# Patient Record
Sex: Female | Born: 1960
Health system: Southern US, Community
[De-identification: ages and names within clinical notes are randomized; demographics above are authoritative.]

## PROBLEM LIST (undated history)

## (undated) DIAGNOSIS — G43909 Migraine, unspecified, not intractable, without status migrainosus: Secondary | ICD-10-CM

## (undated) DIAGNOSIS — E785 Hyperlipidemia, unspecified: Secondary | ICD-10-CM

## (undated) DIAGNOSIS — M1990 Unspecified osteoarthritis, unspecified site: Secondary | ICD-10-CM

## (undated) DIAGNOSIS — E119 Type 2 diabetes mellitus without complications: Secondary | ICD-10-CM

## (undated) DIAGNOSIS — D649 Anemia, unspecified: Secondary | ICD-10-CM

## (undated) DIAGNOSIS — Z9989 Dependence on other enabling machines and devices: Secondary | ICD-10-CM

## (undated) DIAGNOSIS — G4730 Sleep apnea, unspecified: Secondary | ICD-10-CM

## (undated) DIAGNOSIS — F419 Anxiety disorder, unspecified: Secondary | ICD-10-CM

## (undated) DIAGNOSIS — B019 Varicella without complication: Secondary | ICD-10-CM

## (undated) DIAGNOSIS — J302 Other seasonal allergic rhinitis: Secondary | ICD-10-CM

## (undated) DIAGNOSIS — E079 Disorder of thyroid, unspecified: Secondary | ICD-10-CM

## (undated) DIAGNOSIS — N6320 Unspecified lump in the left breast, unspecified quadrant: Secondary | ICD-10-CM

## (undated) DIAGNOSIS — G4733 Obstructive sleep apnea (adult) (pediatric): Secondary | ICD-10-CM

## (undated) DIAGNOSIS — K219 Gastro-esophageal reflux disease without esophagitis: Secondary | ICD-10-CM

## (undated) DIAGNOSIS — G629 Polyneuropathy, unspecified: Secondary | ICD-10-CM

## (undated) HISTORY — DX: Unspecified osteoarthritis, unspecified site: M19.90

## (undated) HISTORY — DX: Obstructive sleep apnea (adult) (pediatric): G47.33

## (undated) HISTORY — DX: Migraine, unspecified, not intractable, without status migrainosus: G43.909

## (undated) HISTORY — DX: Disorder of thyroid, unspecified: E07.9

## (undated) HISTORY — DX: Varicella without complication: B01.9

## (undated) HISTORY — DX: Anemia, unspecified: D64.9

## (undated) HISTORY — DX: Polyneuropathy, unspecified: G62.9

## (undated) HISTORY — DX: Dependence on other enabling machines and devices: Z99.89

## (undated) HISTORY — DX: Hyperlipidemia, unspecified: E78.5

## (undated) HISTORY — PX: BREAST EXCISIONAL BIOPSY: SUR124

## (undated) HISTORY — DX: Other seasonal allergic rhinitis: J30.2

## (undated) HISTORY — DX: Anxiety disorder, unspecified: F41.9

## (undated) HISTORY — PX: BREAST SURGERY: SHX581

## (undated) HISTORY — PX: TUBAL LIGATION: SHX77

---

## 1996-10-20 HISTORY — PX: KNEE ARTHROSCOPY: SUR90

## 2012-02-26 HISTORY — PX: LUMBAR DISC SURGERY: SHX700

## 2012-12-01 ENCOUNTER — Ambulatory Visit (INDEPENDENT_AMBULATORY_CARE_PROVIDER_SITE_OTHER): Payer: 59 | Admitting: Family Medicine

## 2012-12-01 ENCOUNTER — Encounter: Payer: Self-pay | Admitting: Family Medicine

## 2012-12-01 VITALS — BP 112/78 | HR 76 | Temp 98.2°F | Ht 67.25 in | Wt 209.0 lb

## 2012-12-01 DIAGNOSIS — D509 Iron deficiency anemia, unspecified: Secondary | ICD-10-CM

## 2012-12-01 DIAGNOSIS — G4700 Insomnia, unspecified: Secondary | ICD-10-CM

## 2012-12-01 DIAGNOSIS — M792 Neuralgia and neuritis, unspecified: Secondary | ICD-10-CM

## 2012-12-01 DIAGNOSIS — Z0000 Encounter for general adult medical examination without abnormal findings: Secondary | ICD-10-CM

## 2012-12-01 DIAGNOSIS — G8929 Other chronic pain: Secondary | ICD-10-CM

## 2012-12-01 DIAGNOSIS — IMO0002 Reserved for concepts with insufficient information to code with codable children: Secondary | ICD-10-CM

## 2012-12-01 DIAGNOSIS — G5790 Unspecified mononeuropathy of unspecified lower limb: Secondary | ICD-10-CM

## 2012-12-01 LAB — CBC WITH DIFFERENTIAL/PLATELET
Basophils Relative: 0.9 % (ref 0.0–3.0)
Eosinophils Absolute: 0.3 10*3/uL (ref 0.0–0.7)
HCT: 40 % (ref 36.0–46.0)
Lymphs Abs: 3.1 10*3/uL (ref 0.7–4.0)
MCHC: 32.4 g/dL (ref 30.0–36.0)
MCV: 87.8 fl (ref 78.0–100.0)
Monocytes Absolute: 0.4 10*3/uL (ref 0.1–1.0)
Neutro Abs: 2.3 10*3/uL (ref 1.4–7.7)
Neutrophils Relative %: 37.8 % — ABNORMAL LOW (ref 43.0–77.0)
RBC: 4.55 Mil/uL (ref 3.87–5.11)

## 2012-12-01 LAB — LIPID PANEL
Cholesterol: 266 mg/dL — ABNORMAL HIGH (ref 0–200)
HDL: 53 mg/dL (ref >39.00)
Triglycerides: 161 mg/dL — ABNORMAL HIGH (ref 0.0–149.0)
VLDL: 32.2 mg/dL (ref 0.0–40.0)

## 2012-12-01 LAB — BASIC METABOLIC PANEL
BUN: 12 mg/dL (ref 6–23)
Calcium: 9.4 mg/dL (ref 8.4–10.5)
Creatinine, Ser: 0.9 mg/dL (ref 0.4–1.2)
GFR: 81.06 mL/min (ref >60.00)
Glucose, Bld: 113 mg/dL — ABNORMAL HIGH (ref 70–99)
Potassium: 3.7 mEq/L (ref 3.5–5.1)

## 2012-12-01 LAB — TSH: TSH: 1.76 u[IU]/mL (ref 0.35–5.50)

## 2012-12-01 LAB — HEPATIC FUNCTION PANEL
Albumin: 4.1 g/dL (ref 3.5–5.2)
Total Bilirubin: 0.4 mg/dL (ref 0.3–1.2)

## 2012-12-01 MED ORDER — PREGABALIN 75 MG PO CAPS: 75.0000 mg | ORAL_CAPSULE | Freq: Two times a day (BID) | ORAL | Status: DC

## 2012-12-01 NOTE — Patient Instructions (Signed)
Schedule your complete physical w/ pap at your convenience We'll notify you of your lab results and make any changes if needed Start the Lyrica twice daily- please call if it gives you a rash We'll call you with your neurosurgery appt Call with any questions or concerns Happy Belated Birthday! Welcome!  We're glad to have you!!!

## 2012-12-01 NOTE — Progress Notes (Signed)
  Subjective:    Patient ID: Crystal Buchanan, female    DOB: 1961/03/22, 52 y.o.   MRN: [id number]  HPI New to establish.  Recently moved from [place].  Back pain- got hurt on the job March 2013, R sided back pain.  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  Had R sided back surgery May 2013.  Has residual nerve issues w/ numbness of posterior thigh and R foot w/ painful burning.  Pain is constant.  Pt was previously on gabapentin which caused nausea and a rash on chest.  Stopped taking med.  Now ambulating w/ cane.  Currently on Valium for back spasm, naproxen for inflammation.  Unable to sleep due to leg pain.  Has never been on sleep meds.  Anemia- chronic problem, most likely due to menorrhagia.  Still having regular menstrual cycles.  Health maintenance- pt has not had recent routine physical, Mammo Jan 2012, overdue on pap.  Has never had colonoscopy.   Review of Systems For ROS see HPI     Objective:   Physical Exam  Vitals reviewed. Constitutional: She is oriented to person, place, and time. She appears well-developed and well-nourished. No distress.  HENT:  Head: Normocephalic and atraumatic.  Eyes: Conjunctivae and EOM are normal. Pupils are equal, round, and reactive to light.  Neck: Normal range of motion. Neck supple. No thyromegaly present.  Cardiovascular: Normal rate, regular rhythm, normal heart sounds and intact distal pulses.   No murmur heard. Pulmonary/Chest: Effort normal and breath sounds normal. No respiratory distress.  Musculoskeletal: She exhibits no edema.  Lymphadenopathy:    She has no cervical adenopathy.  Neurological: She is alert and oriented to person, place, and time.  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  Psychiatric: She has a normal mood and affect. Her behavior is normal.          Assessment & Plan:

## 2012-12-05 NOTE — Assessment & Plan Note (Signed)
New to provider, ongoing for pt since back surgery.  Will refer to local neurosurg.

## 2012-12-05 NOTE — Assessment & Plan Note (Signed)
New.  Suspect this is due to pt's neuropathic pain.  Will start Lyrica and see if sxs improve prior to starting sleep aid.  Pt expressed understanding and is in agreement w/ plan.

## 2012-12-05 NOTE — Assessment & Plan Note (Signed)
New to provider, chronic for pt.  Will start Lyrica and monitor for sxs improvement.  Will follow.

## 2012-12-05 NOTE — Assessment & Plan Note (Signed)
New to provider, ongoing for pt.  Pt already on iron.  Will check labs.

## 2012-12-07 LAB — VITAMIN D 1,25 DIHYDROXY
Vitamin D 1, 25 (OH)2 Total: 51 pg/mL (ref 18–72)
Vitamin D2 1, 25 (OH)2: <8 pg/mL
Vitamin D3 1, 25 (OH)2: 51 pg/mL

## 2012-12-09 ENCOUNTER — Telehealth: Payer: Self-pay | Admitting: *Deleted

## 2012-12-09 DIAGNOSIS — E785 Hyperlipidemia, unspecified: Secondary | ICD-10-CM

## 2012-12-09 MED ORDER — ATORVASTATIN CALCIUM 20 MG PO TABS: 20.0000 mg | ORAL_TABLET | Freq: Every day | ORAL | Status: DC

## 2012-12-09 NOTE — Telephone Encounter (Signed)
Spoke with the pt and informed her of recent lab results and note.  Pt understood and agreed and she will call back and schedule an lab appt.  New rx sent to the pharmacy by e-script, and lab test ordered.//AB/CMA

## 2012-12-09 NOTE — Telephone Encounter (Signed)
Message copied by Verdie Shire on Thu Dec 09, 2012  8:31 AM ------      Message from: Sheliah Hatch      Created: Mon Dec 06, 2012  9:58 PM       Labs look good w/ exception of total cholesterol and LDL- based on this, needs to start Lipitor 20mg  nightly and repeat LFTs in 6-8 weeks (dx hyperlipidemia) ------

## 2012-12-23 NOTE — Addendum Note (Signed)
Addended by: Sheliah Hatch on: 12/23/2012 10:35 AM   Modules accepted: Orders

## 2013-01-01 ENCOUNTER — Ambulatory Visit (HOSPITAL_BASED_OUTPATIENT_CLINIC_OR_DEPARTMENT_OTHER)
Admission: RE | Admit: 2013-01-01 | Discharge: 2013-01-01 | Disposition: A | Discharge status: Home / Self Care | Payer: BC Managed Care – PPO | Source: Ambulatory Visit | Attending: Family Medicine | Admitting: Family Medicine

## 2013-01-01 DIAGNOSIS — M51379 Other intervertebral disc degeneration, lumbosacral region without mention of lumbar back pain or lower extremity pain: Secondary | ICD-10-CM | POA: Insufficient documentation

## 2013-01-01 DIAGNOSIS — M5137 Other intervertebral disc degeneration, lumbosacral region: Secondary | ICD-10-CM | POA: Insufficient documentation

## 2013-01-01 DIAGNOSIS — G8929 Other chronic pain: Secondary | ICD-10-CM

## 2013-01-01 MED ORDER — GADOBENATE DIMEGLUMINE 529 MG/ML IV SOLN
20.0000 mL | Freq: Once | INTRAVENOUS | Status: AC | PRN
  Administered 2013-01-01: 20 mL via INTRAVENOUS

## 2013-02-15 ENCOUNTER — Encounter: Payer: Self-pay | Admitting: Lab

## 2013-02-16 ENCOUNTER — Encounter: Payer: Self-pay | Admitting: Family Medicine

## 2013-02-16 ENCOUNTER — Ambulatory Visit (INDEPENDENT_AMBULATORY_CARE_PROVIDER_SITE_OTHER): Payer: BC Managed Care – PPO | Admitting: Family Medicine

## 2013-02-16 VITALS — BP 110/76 | HR 78 | Temp 98.5°F | Ht 67.25 in | Wt 215.0 lb

## 2013-02-16 DIAGNOSIS — Z1231 Encounter for screening mammogram for malignant neoplasm of breast: Secondary | ICD-10-CM

## 2013-02-16 DIAGNOSIS — Z0000 Encounter for general adult medical examination without abnormal findings: Secondary | ICD-10-CM

## 2013-02-16 DIAGNOSIS — E785 Hyperlipidemia, unspecified: Secondary | ICD-10-CM | POA: Insufficient documentation

## 2013-02-16 DIAGNOSIS — Z1331 Encounter for screening for depression: Secondary | ICD-10-CM

## 2013-02-16 DIAGNOSIS — Z1211 Encounter for screening for malignant neoplasm of colon: Secondary | ICD-10-CM

## 2013-02-16 NOTE — Assessment & Plan Note (Addendum)
Pt's PE WNL.  Due for GYN exam but pt started period.  Reviewed recent labs.  Will refer for colonoscopy and mammo.  Anticipatory guidance provided.

## 2013-02-16 NOTE — Progress Notes (Signed)
  Subjective:    Patient ID: Crystal Buchanan, female    DOB: 1961/08/24, 52 y.o.   MRN: [id number]  HPI CPE- due for 1st colonoscopy, last mammo 2012- due.  Also due for pap but started cycle.  Hyperlipidemia- noted on recent labs.  Started Lipitor but this caused nausea and stomach ache.  Pt stopped med after 2 weeks.   Review of Systems Patient reports no vision/ hearing changes, adenopathy,fever, weight change,  persistant/recurrent hoarseness , swallowing issues, chest pain, palpitations, edema, persistant/recurrent cough, hemoptysis, dyspnea (rest/exertional/paroxysmal nocturnal), gastrointestinal bleeding (melena, rectal bleeding), abdominal pain, significant heartburn, GU symptoms (dysuria, hematuria, incontinence), Gyn symptoms (abnormal  bleeding, pain),  syncope, focal weakness, memory loss, numbness & tingling, skin/hair/nail changes, abnormal bruising or bleeding, anxiety, or depression.   + blood in stool on April 17-18, none since    Objective:   Physical Exam General Appearance:    Alert, cooperative, no distress, appears stated age  Head:    Normocephalic, without obvious abnormality, atraumatic  Eyes:    PERRL, conjunctiva/corneas clear, EOM's intact, fundi    benign, both eyes  Ears:    Normal TM's and external ear canals, both ears  Nose:   Nares normal, septum midline, mucosa normal, no drainage    or sinus tenderness  Throat:   Lips, mucosa, and tongue normal; teeth and gums normal  Neck:   Supple, symmetrical, trachea midline, no adenopathy;    Thyroid: no enlargement/tenderness/nodules  Back:     Symmetric, no curvature, ROM normal, no CVA tenderness  Lungs:     Clear to auscultation bilaterally, respirations unlabored  Chest Wall:    No tenderness or deformity   Heart:    Regular rate and rhythm, S1 and S2 normal, no murmur, rub   or gallop  Breast Exam:    Deferred  Abdomen:     Soft, non-tender, bowel sounds active all four quadrants,    no masses, no  organomegaly  Genitalia:    Deferred due to menses  Rectal:    Extremities:   Extremities normal, atraumatic, no cyanosis or edema  Pulses:   2+ and symmetric all extremities  Skin:   Skin color, texture, turgor normal, no rashes or lesions  Lymph nodes:   Cervical, supraclavicular, and axillary nodes normal  Neurologic:   CNII-XII intact, walks w/ cane          Assessment & Plan:

## 2013-02-16 NOTE — Patient Instructions (Addendum)
Schedule your pap at your convenience We'll call you with your GI and mammo appts Start the Crestor nightly for the cholesterol Call with any questions or concerns Hang in there!!!

## 2013-02-16 NOTE — Assessment & Plan Note (Signed)
New dx at last labs.  Pt was intolerant to lipitor.  Start samples of Crestor 10mg  to see if nausea improves.  Pt expressed understanding and is in agreement w/ plan.

## 2013-03-03 ENCOUNTER — Ambulatory Visit
Admission: RE | Admit: 2013-03-03 | Discharge: 2013-03-03 | Disposition: A | Discharge status: Home / Self Care | Payer: 59 | Source: Ambulatory Visit | Attending: Family Medicine | Admitting: Family Medicine

## 2013-03-03 DIAGNOSIS — Z1231 Encounter for screening mammogram for malignant neoplasm of breast: Secondary | ICD-10-CM

## 2013-03-07 ENCOUNTER — Encounter: Payer: Self-pay | Admitting: Family Medicine

## 2013-03-15 ENCOUNTER — Other Ambulatory Visit: Payer: Self-pay | Admitting: Family Medicine

## 2013-03-15 DIAGNOSIS — R928 Other abnormal and inconclusive findings on diagnostic imaging of breast: Secondary | ICD-10-CM

## 2013-03-16 ENCOUNTER — Telehealth: Payer: Self-pay | Admitting: Family Medicine

## 2013-03-16 NOTE — Telephone Encounter (Signed)
In reference to Gastroenterology referral entered 02/16/13, [facility] called patient, message were left for a return call.  I have since also tried to reach patient by phone, and mailed a letter.  As of today, 03/16/13, patient will not respond.

## 2013-03-24 ENCOUNTER — Ambulatory Visit
Admission: RE | Admit: 2013-03-24 | Discharge: 2013-03-24 | Disposition: A | Discharge status: Home / Self Care | Payer: 59 | Source: Ambulatory Visit | Attending: Family Medicine | Admitting: Family Medicine

## 2013-03-24 DIAGNOSIS — R928 Other abnormal and inconclusive findings on diagnostic imaging of breast: Secondary | ICD-10-CM

## 2013-06-22 ENCOUNTER — Other Ambulatory Visit (HOSPITAL_COMMUNITY)
Admission: RE | Admit: 2013-06-22 | Discharge: 2013-06-22 | Disposition: A | Discharge status: Home / Self Care | Payer: 59 | Source: Ambulatory Visit | Attending: Family Medicine | Admitting: Family Medicine

## 2013-06-22 ENCOUNTER — Ambulatory Visit (INDEPENDENT_AMBULATORY_CARE_PROVIDER_SITE_OTHER): Payer: 59 | Admitting: Family Medicine

## 2013-06-22 ENCOUNTER — Encounter: Payer: Self-pay | Admitting: Family Medicine

## 2013-06-22 VITALS — BP 122/80 | HR 88 | Temp 98.4°F | Ht 67.25 in | Wt 213.6 lb

## 2013-06-22 DIAGNOSIS — Z0000 Encounter for general adult medical examination without abnormal findings: Secondary | ICD-10-CM

## 2013-06-22 DIAGNOSIS — Z01419 Encounter for gynecological examination (general) (routine) without abnormal findings: Secondary | ICD-10-CM | POA: Insufficient documentation

## 2013-06-22 DIAGNOSIS — N841 Polyp of cervix uteri: Secondary | ICD-10-CM

## 2013-06-22 DIAGNOSIS — Z1151 Encounter for screening for human papillomavirus (HPV): Secondary | ICD-10-CM | POA: Insufficient documentation

## 2013-06-22 DIAGNOSIS — Z124 Encounter for screening for malignant neoplasm of cervix: Secondary | ICD-10-CM

## 2013-06-22 NOTE — Patient Instructions (Addendum)
Follow up in November to recheck cholesterol We'll call you with your GYN appt Keep up the good work!  You look great! Happy September!

## 2013-06-22 NOTE — Progress Notes (Signed)
  Subjective:    Patient ID: Crystal Buchanan, female    DOB: 1961-04-02, 52 y.o.   MRN: [id number]  HPI PAP- pt had CPE in April but was having period.  Returns today for pap, breast exam, and EKG.  UTD on mammo   Review of Systems Denies breast pain, nipple drainage, masses.  Denies vaginal d/c, pain.  No menses since April.    Objective:   Physical Exam  Constitutional: She appears well-developed and well-nourished. No distress.  Genitourinary: Rectal exam shows no external hemorrhoid and anal tone normal. No breast swelling, tenderness, discharge or bleeding. There is no rash, tenderness or lesion on the right labia. There is no rash, tenderness or lesion on the left labia. Uterus is not deviated, not enlarged, not fixed and not tender. Cervix exhibits no motion tenderness, no discharge and no friability. Right adnexum displays no mass, no tenderness and no fullness. Left adnexum displays no mass, no tenderness and no fullness. No tenderness or bleeding around the vagina. No signs of injury around the vagina. No vaginal discharge found.            Assessment & Plan:

## 2013-06-23 ENCOUNTER — Encounter: Payer: Self-pay | Admitting: Neurology

## 2013-06-23 ENCOUNTER — Ambulatory Visit (INDEPENDENT_AMBULATORY_CARE_PROVIDER_SITE_OTHER): Payer: 59 | Admitting: Neurology

## 2013-06-23 VITALS — BP 136/91 | HR 78 | Temp 98.4°F | Ht 67.5 in | Wt 214.0 lb

## 2013-06-23 DIAGNOSIS — G4733 Obstructive sleep apnea (adult) (pediatric): Secondary | ICD-10-CM

## 2013-06-23 DIAGNOSIS — E669 Obesity, unspecified: Secondary | ICD-10-CM

## 2013-06-23 NOTE — Progress Notes (Signed)
Subjective:    Patient ID: Crystal Buchanan is a 52 y.o. female.  HPI  Huston Foley, MD, PhD [facility] [address], [address] [address] [place], [place] [address]  Dear Dr. Murray Hodgkins,   I saw your patient, Crystal Buchanan, upon your kind request in my neurologic clinic today for initial consultation of her sleep disorder, in particular concern for obstructive sleep apnea. The patient is unaccompanied today. As you know, [name] is a very pleasant 52 year old right-handed woman with an underlying medical history of chronic low back pain and failed back surgery syndrome who has been on chronic narcotic pain medication for the past several 18 months. She had her screening sleep study which per your office note showed an AHI of 17.2 per hour indicating obstructive sleep apnea. She reports pain at night, in particular radiating back pain to the R leg and R foot. She has been scheduled for an epidural injection next week and has been given Valium for the day of the injection. She has also been given a Rx for Robaxin, which she will start today. In addition, she is on Cymbalta, which she has not been taking for the past 1 1/2 weeks, because of stomach pain, she is on Percocet 1-2 pills per day, but not daily as she is afraid to get "hooked onto pills". She had SE to Lyrica.  For the past 18 months, she has had severe issues with sleep initiation. She may go to bed around 10-11 PM and she is up for hours and it may be around 5 AM, that she falls asleep. She has morning HAs. She endorses restless legs Symptoms on the R side, and sometimes her husband massages her leg. She has been moving and kicking her R leg. She is up at 8 AM. She feels poorly rested upon awakening and often has a morning HA. She is somewhat confused about how to take all her medications and takes most medications as needed and infrequently. Recently, when she took Zanaflex at night, she slept fairly well.  She has not  fallen asleep while driving.   She has been known to snore for the past many years. Snoring is reportedly marked, and associated with choking sounds and witnessed apneas. The patient admits to a sense of choking or strangling feeling. There is report of occasional nighttime reflux, with rare nighttime cough experienced.  She is a restless sleeper and in the morning, the bed is quite disheveled.   She denies cataplexy, sleep paralysis, hypnagogic or hypnopompic hallucinations, or sleep attacks. She does not report any vivid dreams, nightmares, dream enactments, or parasomnias, such as sleep talking or sleep walking.   She consumes 1 caffeinated beverage per day, not every day, and usually in the form of coffee.  Her bedroom is usually dark and cool. There is a TV in the bedroom and usually it is on at night.   Her Past Medical History Is Significant For: Past Medical History  Diagnosis Date  . Chicken pox   . Migraine   . Thyroid disease   . Hyperlipidemia     Her Past Surgical History Is Significant For: Past Surgical History  Procedure Laterality Date  . Lumbar disc surgery  02-26-12  . Knee arthroscopy  1998  . Breast surgery      Her Family History Is Significant For: Family History  Problem Relation Age of Onset  . Heart disease Mother   . Arthritis Father   . Hyperlipidemia Father   .  Heart disease Father   . Stroke Father   . Hypertension Father   . Diabetes Father     Her Social History Is Significant For: History   Social History  . Marital Status: Married    Spouse Name: N/A    Number of Children: N/A  . Years of Education: N/A   Social History Main Topics  . Smoking status: Never Smoker   . Smokeless tobacco: None  . Alcohol Use: No  . Drug Use: No  . Sexual Activity: None   Other Topics Concern  . None   Social History Narrative  . None    Her Allergies Are:  Allergies  Allergen Reactions  . Atorvastatin Nausea Only    Along with stomachaches   . Lyrica [Pregabalin]     Dizziness,blurred vision,increased appetite,h/a  . Codeine Palpitations  . Gabapentin Rash    Itching and nausa  :   Her Current Medications Are:  Outpatient Encounter Prescriptions as of 06/23/2013  Medication Sig Dispense Refill  . diazepam (VALIUM) 5 MG tablet Take 5 mg by mouth every 6 (six) hours as needed for anxiety.      . DULoxetine (CYMBALTA) 30 MG capsule Take 30 mg by mouth daily.      . ferrous sulfate 325 (65 FE) MG tablet Take 325 mg by mouth 2 (two) times daily.      . methocarbamol (ROBAXIN) 500 MG tablet Take 500 mg by mouth daily.      Marland Kitchen oxyCODONE-acetaminophen (PERCOCET/ROXICET) 5-325 MG per tablet Take 1 tablet by mouth every 6 (six) hours as needed for pain.      Marland Kitchen tiZANidine (ZANAFLEX) 2 MG tablet Take 2 mg by mouth every 6 (six) hours as needed.      . [DISCONTINUED] naproxen (NAPROSYN) 500 MG tablet Take 500 mg by mouth 2 (two) times daily with a meal.      . [DISCONTINUED] pregabalin (LYRICA) 75 MG capsule Take 1 capsule (75 mg total) by mouth 2 (two) times daily.  60 capsule  1   No facility-administered encounter medications on file as of 06/23/2013.   Review of Systems  Constitutional: Positive for activity change, fatigue and unexpected weight change.  Respiratory:       Snoring  Endocrine: Positive for heat intolerance.  Musculoskeletal: Positive for arthralgias.       Cramps  Hematological:       Anemia  Psychiatric/Behavioral: Positive for sleep disturbance, dysphoric mood and decreased concentration. The patient is nervous/anxious.     Objective:  Neurologic Exam  Physical Exam Physical Examination:   Filed Vitals:   06/23/13 1001  BP: 136/91  Pulse: 78  Temp: 98.4 F (36.9 C)    General Examination: The patient is a very pleasant 52 y.o. female in no acute distress. She appears well-developed and well-nourished and well groomed. She is overweight.  HEENT: Normocephalic, atraumatic, pupils are equal, round and  reactive to light and accommodation. Funduscopic exam is normal with sharp disc margins noted. Extraocular tracking is good without limitation to gaze excursion or nystagmus noted. Normal smooth pursuit is noted. Hearing is grossly intact. Tympanic membranes are clear bilaterally. Face is symmetric with normal facial animation and normal facial sensation. Speech is clear with no dysarthria noted. There is no hypophonia. There is no lip, neck/head, jaw or voice tremor. Neck is supple with full range of passive and active motion. There are no carotid bruits on auscultation. Oropharynx exam reveals: mild mouth dryness, good dental hygiene and moderate  airway crowding, due to redundant soft palate and tonsillar size of 1-2+. Mallampati is class III. Tongue protrudes centrally and palate elevates symmetrically. Neck size is 15.5 inches.   Chest: Clear to auscultation without wheezing, rhonchi or crackles noted.  Heart: S1+S2+0, regular and normal without murmurs, rubs or gallops noted.   Abdomen: Soft, non-tender and non-distended with normal bowel sounds appreciated on auscultation.  Extremities: There is no pitting edema in the distal lower extremities bilaterally. Pedal pulses are intact.  Skin: Warm and dry without trophic changes noted. There are no varicose veins.  Musculoskeletal: exam reveals no obvious joint deformities, tenderness or joint swelling or erythema.   Neurologically:  Mental status: The patient is awake, alert and oriented in all 4 spheres. Her memory, attention, language and knowledge are appropriate. There is no aphasia, agnosia, apraxia or anomia. Speech is clear with normal prosody and enunciation. Thought process is linear. Mood is congruent and affect is normal.  Cranial nerves are as described above under HEENT exam. In addition, shoulder shrug is normal with equal shoulder height noted. Motor exam: Normal bulk, strength and tone is noted. There is no drift, tremor or  rebound. Romberg is negative. Reflexes are 2+ throughout. Toes are downgoing bilaterally. Fine motor skills are intact with normal finger taps, normal hand movements, normal rapid alternating patting, normal foot taps and normal foot agility.  Cerebellar testing shows no dysmetria or intention tremor on finger to nose testing. Heel to shin is unremarkable bilaterally. There is no truncal or gait ataxia.  Sensory exam is intact to light touch, pinprick, vibration, temperature sense and proprioception in the upper and lower extremities with the exception of decrease in PP and temperature sense in the R leg distal to the knee.  Gait, station and balance: she stands up with mild difficulty and has to push herself up. She walks with a limp on the R. She did not bring her cane. No veering to one side is noted. No leaning to one side is noted. Posture is age-appropriate and stance is wide based. No problems turning are noted. Tandem walk is difficult initially. Intact toe on L, but cannot do it well on R. Unable to perform heel stance.               Assessment and Plan:  In summary, Shirle Provencal is a very pleasant 52 y.o.-year old female with a history of chronic back pain and a history and physical exam concerning for obstructive sleep apnea (OSA). I had a long chat with the patient about my findings and the diagnosis, its prognosis and treatment options. We talked about medical treatments and non-pharmacological approaches. I explained in particular the risks and ramifications of untreated moderate to severe OSA, especially with respect to developing cardiovascular disease down the Road, including congestive heart failure, difficult to treat hypertension, cardiac arrhythmias, or stroke. Even type 2 diabetes has in part been linked to untreated OSA. Patients with chronic pain sometimes report an improvement in their pain level with treatment of OSA. We talked about trying to maintain a healthy lifestyle in  general, as well as the importance of weight control. I encouraged the patient to eat healthy, exercise daily and keep well hydrated, to keep a scheduled bedtime and wake time routine, to not skip any meals and eat healthy snacks in between meals.  I recommended the following at this time: sleep study with potential CPAP titration.  I explained the sleep test procedure to the patient and also outlined  surgical and non-surgical treatment options of OSA including the use of a dental custom-made appliance, upper airway surgery such as pillar implants, radiofrequency surgery, tongue base surgery, and UPPP. I also explained the CPAP treatment option to the patient, who indicated that she would be willing to try CPAP if the need arises. I explained the importance of being compliant with PAP treatment, not only for insurance purposes but primarily for the patient's long term health benefit. I answered all her questions today and the patient was in agreement. I would like to see her back after the sleep study is completed and encouraged her to call with any interim questions, concerns, problems or updates.   Thank you very much for allowing me to participate in the care of this nice patient. If I can be of any further assistance to you please do not hesitate to call me at [phone].  Sincerely,   Huston Foley, MD, PhD

## 2013-06-23 NOTE — Patient Instructions (Addendum)

## 2013-06-27 ENCOUNTER — Encounter: Payer: Self-pay | Admitting: General Practice

## 2013-07-03 NOTE — Assessment & Plan Note (Signed)
Pap collected.  + cervical polyp- refer to GYN.  Breast exam WNL.

## 2013-07-10 ENCOUNTER — Ambulatory Visit (INDEPENDENT_AMBULATORY_CARE_PROVIDER_SITE_OTHER): Payer: 59 | Admitting: Neurology

## 2013-07-10 DIAGNOSIS — E669 Obesity, unspecified: Secondary | ICD-10-CM

## 2013-07-10 DIAGNOSIS — G479 Sleep disorder, unspecified: Secondary | ICD-10-CM

## 2013-07-10 DIAGNOSIS — IMO0002 Reserved for concepts with insufficient information to code with codable children: Secondary | ICD-10-CM

## 2013-07-10 DIAGNOSIS — G4733 Obstructive sleep apnea (adult) (pediatric): Secondary | ICD-10-CM

## 2013-07-11 NOTE — Sleep Study (Signed)
See media tab for full report  

## 2013-07-22 ENCOUNTER — Telehealth: Payer: Self-pay | Admitting: Neurology

## 2013-07-22 DIAGNOSIS — G4733 Obstructive sleep apnea (adult) (pediatric): Secondary | ICD-10-CM

## 2013-07-22 NOTE — Telephone Encounter (Signed)
Please call and notify the patient that the recent sleep study did confirm the diagnosis of obstructive sleep apnea and that I recommend treatment for this in the form of CPAP. Findings were mild but the severely decreased TST may underestimate her OSA. Given her history of snoring and witnessed apnea, and chronic pain with a previously abnormal home sleep test, I think she should try CPAP. I have placed an order in the chart. Thanks, Huston Foley, MD, PhD [facility])

## 2013-07-27 ENCOUNTER — Encounter: Payer: Self-pay | Admitting: *Deleted

## 2013-07-27 NOTE — Telephone Encounter (Signed)
Called patient to discuss sleep study results.  Discussed findings, recommendations and follow up care.  Patient understood well and all questions were answered.   Patient had some questions about insurance coverage for follow up testing and for CPAP.  We discussed the DME company and how that would all work.  Patient would like to discuss with her husband and call us back.  I explained I would be sending results to her home so they could review together and invited them both to call me if he had further questions after they had reviewed the info together.  I also invited them to schedule an appointment with Dr. Frances Furbish to discuss results and have any questions answered if they wanted to do so.   Copy of study was faxed to Dr. Callie Fielding and forwarded in Epic to Dr. Beverely Low. -sh

## 2013-08-05 ENCOUNTER — Encounter: Payer: Self-pay | Admitting: *Deleted

## 2013-08-05 NOTE — Telephone Encounter (Signed)
Called and spoke again with patient and she has decided to go ahead with CPAP Titration, this was scheduled for 08/23/13 at 9:30 PM and will be covered at 100% by her insurance.  Patient will be sent a follow up letter with this appointment date.

## 2013-08-05 NOTE — Telephone Encounter (Signed)
Staff message from Kissa from 08/04/13:    Pt called for you because she had not heard anything regarding the next step in her treatment based on findings from sleep study. Please return call.

## 2013-08-23 ENCOUNTER — Ambulatory Visit (INDEPENDENT_AMBULATORY_CARE_PROVIDER_SITE_OTHER): Payer: 59

## 2013-08-23 DIAGNOSIS — IMO0002 Reserved for concepts with insufficient information to code with codable children: Secondary | ICD-10-CM

## 2013-08-23 DIAGNOSIS — G479 Sleep disorder, unspecified: Secondary | ICD-10-CM

## 2013-08-23 DIAGNOSIS — G4733 Obstructive sleep apnea (adult) (pediatric): Secondary | ICD-10-CM

## 2013-09-02 ENCOUNTER — Telehealth: Payer: Self-pay | Admitting: *Deleted

## 2013-09-02 ENCOUNTER — Telehealth: Payer: Self-pay | Admitting: Neurology

## 2013-09-02 DIAGNOSIS — G4733 Obstructive sleep apnea (adult) (pediatric): Secondary | ICD-10-CM

## 2013-09-02 NOTE — Telephone Encounter (Signed)
Last ov and labs faxed to Dr. Renaldo Fiddler.

## 2013-09-02 NOTE — Telephone Encounter (Signed)
Please call and inform patient that I have entered an order for treatment with PAP. We will arrange for a machine through a DME company and I will see the patient back in follow-up in about 6 weeks. I will be looking out for compliance data downloaded from the machine at the time of the followup appointment and discuss how it is going with PAP treatment at the time of the visit. Please also make sure, the patient has a follow-up appointment with me in 6 weeks from the setup date, thanks.  Judithe Keetch, MD, PhD [facility] ([facility])  

## 2013-09-02 NOTE — Telephone Encounter (Signed)
09/02/2013  Crystal Buchanan at [facility] called in reference to referral.  They are requesting her medical records in order to know patients needs of this referral.  Please advise.  bw

## 2013-09-02 NOTE — Telephone Encounter (Signed)
09/02/2013  Raynelle Fanning at [facility]  [phone]  Ext. [phone]

## 2013-09-04 IMAGING — US US BREAST*L*
1 series · 1 of 1 positions shown · non-contrast
Comparison: 09/23/2010 and 10/04/2010

CLINICAL DATA: Patient presents for additional views of the left
breast as follow-up to recent screening exam suggesting a possible
mass.

DIGITAL DIAGNOSTIC LEFT MAMMOGRAM  AND LEFT BREAST ULTRASOUND:

[Series 1: us breast*left* · 1 of 1 slices shown]
[im 1/1]
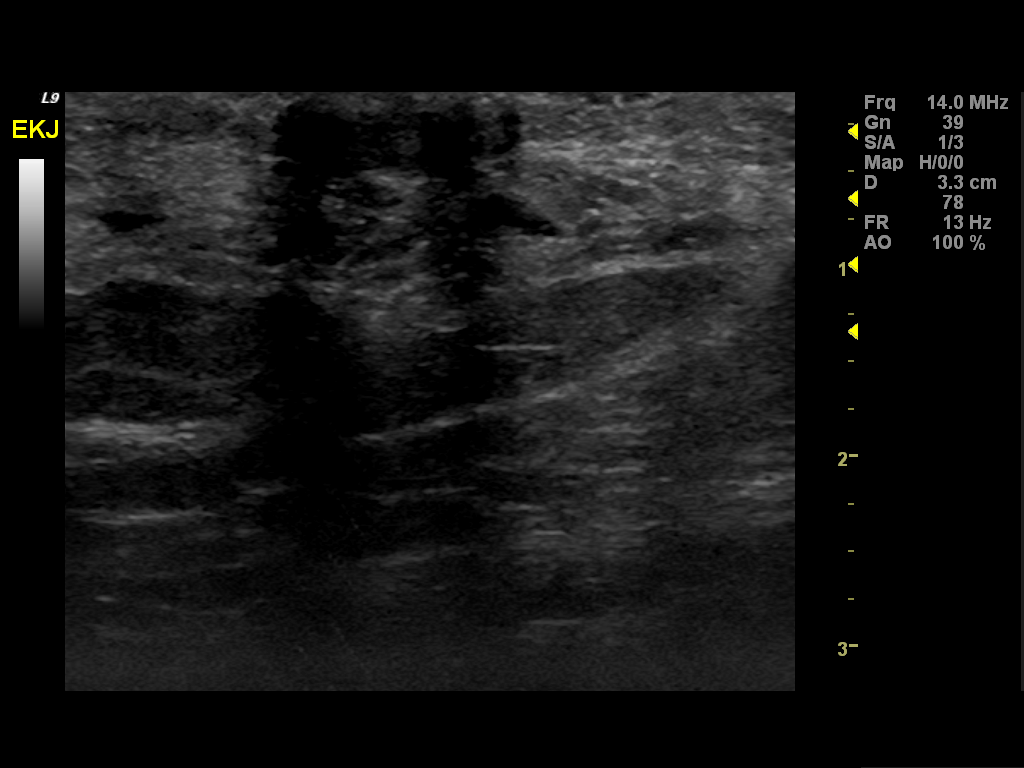

[1 of 1 positions shown; findings below may reference images not displayed]

FINDINGS: ACR Breast Density Category 2: There is a scattered fibroglandular
pattern.

Spot compression images demonstrate no definite focal mass in the
retroareolar region as findings are similar to the prior exam.

Ultrasound is performed, showing no focal abnormality in the left
retroareolar region.
IMPRESSION: No focal abnormality in the left retroareolar region.

RECOMMENDATION:
Recommend continued annual bilateral screening mammographic follow-
up.

I have discussed the findings and recommendations with the patient.
Results were also provided in writing at the conclusion of the
visit.  If applicable, a reminder letter will be sent to the
patient regarding the next appointment.

BI-RADS CATEGORY 1:  Negative.

## 2013-09-05 ENCOUNTER — Encounter: Payer: Self-pay | Admitting: *Deleted

## 2013-09-05 NOTE — Telephone Encounter (Signed)
I called and spoke with the patient about her CPAP Titration study to inform her that it was sucessful and that Dr. Frances Furbish would like for her to start CPAP therapy at home. Patient understood and her order will be sent to [facility]. I also informed the patient that I will mail her copy of the report and letter with follow information.

## 2013-10-28 ENCOUNTER — Ambulatory Visit (INDEPENDENT_AMBULATORY_CARE_PROVIDER_SITE_OTHER): Payer: 59 | Admitting: Family Medicine

## 2013-10-28 ENCOUNTER — Encounter: Payer: Self-pay | Admitting: Family Medicine

## 2013-10-28 VITALS — BP 120/80 | HR 76 | Temp 98.1°F | Resp 16 | Wt 207.5 lb

## 2013-10-28 DIAGNOSIS — E785 Hyperlipidemia, unspecified: Secondary | ICD-10-CM

## 2013-10-28 DIAGNOSIS — K219 Gastro-esophageal reflux disease without esophagitis: Secondary | ICD-10-CM

## 2013-10-28 LAB — LIPID PANEL
Cholesterol: 257 mg/dL — ABNORMAL HIGH (ref 0–200)
HDL: 48.6 mg/dL (ref >39.00)
Total CHOL/HDL Ratio: 5
Triglycerides: 224 mg/dL — ABNORMAL HIGH (ref 0.0–149.0)
VLDL: 44.8 mg/dL — ABNORMAL HIGH (ref 0.0–40.0)

## 2013-10-28 LAB — HEPATIC FUNCTION PANEL
ALK PHOS: 56 U/L (ref 39–117)
ALT: 16 U/L (ref 0–35)
AST: 18 U/L (ref 0–37)
Albumin: 3.9 g/dL (ref 3.5–5.2)
Bilirubin, Direct: 0 mg/dL (ref 0.0–0.3)
TOTAL PROTEIN: 7.4 g/dL (ref 6.0–8.3)
Total Bilirubin: 0.4 mg/dL (ref 0.3–1.2)

## 2013-10-28 LAB — BASIC METABOLIC PANEL
BUN: 6 mg/dL (ref 6–23)
CHLORIDE: 106 meq/L (ref 96–112)
CO2: 27 meq/L (ref 19–32)
Calcium: 9.1 mg/dL (ref 8.4–10.5)
Creatinine, Ser: 0.9 mg/dL (ref 0.4–1.2)
GFR: 86.02 mL/min (ref >60.00)
GLUCOSE: 111 mg/dL — AB (ref 70–99)
POTASSIUM: 3.5 meq/L (ref 3.5–5.1)
SODIUM: 140 meq/L (ref 135–145)

## 2013-10-28 LAB — LDL CHOLESTEROL, DIRECT: LDL DIRECT: 172.7 mg/dL

## 2013-10-28 MED ORDER — PANTOPRAZOLE SODIUM 40 MG PO TBEC: 40.0000 mg | DELAYED_RELEASE_TABLET | Freq: Every day | ORAL | Status: DC

## 2013-10-28 NOTE — Assessment & Plan Note (Signed)
New.  Start Protonix as pt reports fairly severe sxs.  Reviewed dietary and lifestyle modifications.  Will follow.

## 2013-10-28 NOTE — Patient Instructions (Signed)
Schedule your complete physical in 6 months We'll notify you of your lab results and make any changes if needed Start the Protonix daily for the acid Drink plenty of fluids Call with any questions or concerns Hang in there! Happy Early Rudene Anda!

## 2013-10-28 NOTE — Progress Notes (Signed)
   Subjective:    Patient ID: Crystal Buchanan, female    DOB: 09-10-1961, 53 y.o.   MRN: [id number]  HPI Hyperlipidemia- noted on labs Feb 2014, was prescribed Lipitor but had side effects.  Stopped the med, was given Crestor samples.  Had no side effects on Crestor 10mg  samples.  No abd pain, N/V.  Esophageal spasm- pt reports that she will intermittently have pressure substernally and up into throat that will take her breath away.  'you can taste it, it's acid-y' Is on multiple pain meds, NSAIDs, etc.  + GERD.   Review of Systems For ROS see HPI     Objective:   Physical Exam  Vitals reviewed. Constitutional: She is oriented to person, place, and time. She appears well-developed and well-nourished. No distress.  HENT:  Head: Normocephalic and atraumatic.  Eyes: Conjunctivae and EOM are normal. Pupils are equal, round, and reactive to light.  Neck: Normal range of motion. Neck supple. No thyromegaly present.  Cardiovascular: Normal rate, regular rhythm, normal heart sounds and intact distal pulses.   No murmur heard. Pulmonary/Chest: Effort normal and breath sounds normal. No respiratory distress.  Abdominal: Soft. She exhibits no distension. There is no tenderness.  Musculoskeletal: She exhibits no edema.  Lymphadenopathy:    She has no cervical adenopathy.  Neurological: She is alert and oriented to person, place, and time.  Skin: Skin is warm and dry.  Psychiatric: She has a normal mood and affect. Her behavior is normal.          Assessment & Plan:

## 2013-10-28 NOTE — Assessment & Plan Note (Signed)
Pt has been off all meds b/c she did not call for prescription after finishing Crestor samples.  Check labs today to determine starting point and pick appropriate med dose based on results.  Pt expressed understanding and is in agreement w/ plan.

## 2013-10-28 NOTE — Progress Notes (Signed)
Pre visit review using our clinic review tool, if applicable. No additional management support is needed unless otherwise documented below in the visit note. 

## 2013-10-31 ENCOUNTER — Other Ambulatory Visit: Payer: Self-pay | Admitting: General Practice

## 2013-10-31 MED ORDER — ROSUVASTATIN CALCIUM 20 MG PO TABS: 20.0000 mg | ORAL_TABLET | Freq: Every day | ORAL | Status: DC

## 2013-11-08 ENCOUNTER — Ambulatory Visit (INDEPENDENT_AMBULATORY_CARE_PROVIDER_SITE_OTHER): Payer: 59 | Admitting: Neurology

## 2013-11-08 ENCOUNTER — Encounter: Payer: Self-pay | Admitting: Neurology

## 2013-11-08 VITALS — BP 125/79 | HR 79 | Ht 67.5 in | Wt 209.0 lb

## 2013-11-08 DIAGNOSIS — G8929 Other chronic pain: Secondary | ICD-10-CM

## 2013-11-08 DIAGNOSIS — Z8639 Personal history of other endocrine, nutritional and metabolic disease: Secondary | ICD-10-CM

## 2013-11-08 DIAGNOSIS — G4733 Obstructive sleep apnea (adult) (pediatric): Secondary | ICD-10-CM

## 2013-11-08 DIAGNOSIS — E669 Obesity, unspecified: Secondary | ICD-10-CM

## 2013-11-08 DIAGNOSIS — Z862 Personal history of diseases of the blood and blood-forming organs and certain disorders involving the immune mechanism: Secondary | ICD-10-CM

## 2013-11-08 DIAGNOSIS — M549 Dorsalgia, unspecified: Secondary | ICD-10-CM

## 2013-11-08 NOTE — Progress Notes (Signed)
Subjective:    Patient ID: Crystal Buchanan is a 53 y.o. female.  HPI  Interim history:   Crystal Buchanan is a very pleasant 52 year old right-handed woman with an underlying medical history of chronic low back pain and failed back surgery syndrome, on chronic narcotic pain medication for the past several months, who presents for followup consultation of her obstructive sleep apnea. The patient is unaccompanied today. I first met her on 06/23/2013 at the request of her pain management doctor, Dr. Brien Few. She had a home sleep test in his office showing an AHI of 17.2 per hour. I suggested that she return for sleep study. She had a baseline sleep study on 07/10/2013 and I went over her test results with her in detail today. I also explained to her her second sleep study which was a CPAP titration study on 08/23/2013. Her baseline sleep study from 07/10/2013 showed a sleep efficiency of 41.3% only. She had a markedly prolonged sleep latency at 116 minutes and a high amount of wake after sleep onset at 158 minutes with moderate sleep fragmentation noted. She had increased percentage of light stage sleep, decreased percentage of slow-wave sleep and a decreased percentage of REM sleep was highly prolonged REM latency. She had mild intermittent snoring. She only slept on her sides. She had a total AHI of 10.6 per hour, rising to 13.8 per hour and REM sleep. Her baseline oxygen saturation was 93% on her nadir was 87%. Her CPAP titration study from 08/23/2013 showed a sleep efficiency of 32.5% only. Sleep latency was 62 minutes and wake after sleep onset was high at 194.5 minutes with mild sleep fragmentation noted and she was essentially unable to achieve sleep after 3 AM. Her arousal index was normal. She had an increased percentage of light stage sleep, absence of deep sleep, and a decreased percentage of REM sleep with prolonged REM latency. Her baseline oxygen saturation was 94%, her nadir was 89%. CPAP was initiated at  5 cm and titrated to 6 cm on which were AHI was 0.7 per hour. She did not have any significant periodic leg movements of sleep in either study. Based on the test results I suggested she start CPAP treatment at 6 cm of water pressure. Today, I reviewed her compliance data from her machine: 10/04/2013 through 11/02/2013 which is a total of 30 days during which time she uses CPAP every night except for 2 nights. Percent used days greater than 4 hours was 93%, indicating excellent compliance. Her pressure is 6 cm with a PR of 2, her average usage was 5 hours and 35 minutes, her residual AHI is low at 0.5 per hour, and her leak was very low.  Today, she reports sleeping better, her husband reports back to her that she sleeps more consolidated, no longer snores and does not seem as restless. She still has trouble falling asleep. She had an epidural injection under Dr. Brien Few. She will need fibroid removal surgery under Dr. Julien Girt. She had blood work under Dr. [name], PCP, which showed a normal CBC, BMP, liver function, but elevated cholesterol and normal vitamin D. She has been started on Crestor. She had some symptoms of regurgitation and acid reflux and was also started on pantoprazole. She takes iron supplements and takes it once daily. She has a Hx of Fe deficiency. She will be seeing an orthopedic Dr. For her L knee.   She has had severe issues with sleep initiation for months. She endorses restless legs Symptoms on the  R side, and sometimes her husband massages her leg. She has been moving and kicking her R leg.   Her Past Medical History Is Significant For: Past Medical History  Diagnosis Date  . Chicken pox   . Migraine   . Thyroid disease   . Hyperlipidemia     Her Past Surgical History Is Significant For: Past Surgical History  Procedure Laterality Date  . Lumbar disc surgery  02-26-12  . Knee arthroscopy  1998  . Breast surgery      Her Family History Is Significant For: Family History   Problem Relation Age of Onset  . Heart disease Mother   . Arthritis Father   . Hyperlipidemia Father   . Heart disease Father   . Stroke Father   . Hypertension Father   . Diabetes Father     Her Social History Is Significant For: History   Social History  . Marital Status: Married    Spouse Name: N/A    Number of Children: 3  . Years of Education: 12th   Occupational History  . N/A    Social History Main Topics  . Smoking status: Never Smoker   . Smokeless tobacco: None  . Alcohol Use: No  . Drug Use: No  . Sexual Activity: None   Other Topics Concern  . None   Social History Narrative  . None    Her Allergies Are:  Allergies  Allergen Reactions  . Atorvastatin Nausea Only    Along with stomachaches  . Lyrica [Pregabalin]     Dizziness,blurred vision,increased appetite,h/a  . Codeine Palpitations  . Gabapentin Rash    Itching and nausa  :   Her Current Medications Are:  Outpatient Encounter Prescriptions as of 11/08/2013  Medication Sig  . DULoxetine (CYMBALTA) 30 MG capsule Take 30 mg by mouth daily.  . ferrous sulfate 325 (65 FE) MG tablet Take 325 mg by mouth 2 (two) times daily.  . methocarbamol (ROBAXIN) 500 MG tablet Take 500 mg by mouth daily.  Marland Kitchen oxyCODONE-acetaminophen (PERCOCET/ROXICET) 5-325 MG per tablet Take 1 tablet by mouth every 6 (six) hours as needed for pain.  . pantoprazole (PROTONIX) 40 MG tablet Take 1 tablet (40 mg total) by mouth daily.  . rosuvastatin (CRESTOR) 20 MG tablet Take 1 tablet (20 mg total) by mouth daily.  Marland Kitchen tiZANidine (ZANAFLEX) 2 MG tablet Take 2 mg by mouth every 6 (six) hours as needed.  . [DISCONTINUED] diazepam (VALIUM) 5 MG tablet Take 5 mg by mouth every 6 (six) hours as needed for anxiety.  :  Review of Systems:  Out of a complete 14 point review of systems, all are reviewed and negative with the exception of these symptoms as listed below:   Review of Systems  Constitutional: Positive for appetite change.   Gastrointestinal: Positive for abdominal pain.  Endocrine:       Excessive Eating  Genitourinary:       Frequency of Urination  Musculoskeletal: Positive for neck stiffness.  Neurological: Positive for weakness, numbness and headaches.       Memory loss  Hematological:       Anemia  Psychiatric/Behavioral: The patient is nervous/anxious.        Restless legs, Insomnia,Snoring    Objective:  Neurologic Exam  Physical Exam Physical Examination:   Filed Vitals:   11/08/13 0828  BP: 125/79  Pulse: 79    General Examination: The patient is a very pleasant 53 y.o. female in no acute distress. She appears  well-developed and well-nourished and well groomed. She is overweight.  HEENT: Normocephalic, atraumatic, pupils are equal, round and reactive to light and accommodation. Funduscopic exam is normal with sharp disc margins noted. Extraocular tracking is good without limitation to gaze excursion or nystagmus noted. Normal smooth pursuit is noted. Hearing is grossly intact. Tympanic membranes are clear bilaterally. Face is symmetric with normal facial animation and normal facial sensation. Speech is clear with no dysarthria noted. There is no hypophonia. There is no lip, neck/head, jaw or voice tremor. Neck is supple with full range of passive and active motion. There are no carotid bruits on auscultation. Oropharynx exam reveals: mild mouth dryness, good dental hygiene and moderate airway crowding, due to redundant soft palate and tonsillar size of 1-2+. Mallampati is class III. Tongue protrudes centrally and palate elevates symmetrically. Neck size is mildly enlarged.    Chest: Clear to auscultation without wheezing, rhonchi or crackles noted.  Heart: S1+S2+0, regular and normal without murmurs, rubs or gallops noted.   Abdomen: Soft, non-tender and non-distended with normal bowel sounds appreciated on auscultation.  Extremities: There is no pitting edema in the distal lower  extremities bilaterally. Pedal pulses are intact. Mild, non-pitting puffiness is noted in the R ankle.   Skin: Warm and dry without trophic changes noted. There are no varicose veins.  Musculoskeletal: exam reveals no obvious joint deformities, tenderness or joint swelling or erythema.   Neurologically:  Mental status: The patient is awake, alert and oriented in all 4 spheres. Her memory, attention, language and knowledge are appropriate. There is no aphasia, agnosia, apraxia or anomia. Speech is clear with normal prosody and enunciation. Thought process is linear. Mood is congruent and affect is normal.  Cranial nerves are as described above under HEENT exam. In addition, shoulder shrug is normal with equal shoulder height noted. Motor exam: Normal bulk, strength and tone is noted. There is no drift, tremor or rebound. Romberg is negative. Reflexes are 2+ throughout. Toes are downgoing bilaterally. Fine motor skills are intact with normal finger taps, normal hand movements, normal rapid alternating patting, normal foot taps and normal foot agility.  Cerebellar testing shows no dysmetria or intention tremor on finger to nose testing. Heel to shin is unremarkable bilaterally. There is no truncal or gait ataxia.  Sensory exam is intact to light touch, pinprick, vibration, temperature sense in the upper and lower extremities with the exception of decrease in PP and temperature sense in the R leg distal to the knee.  Gait, station and balance: she stands up with mild difficulty and has to push herself up. She walks with a limp on the R. She brought her cane. No veering to one side is noted. No leaning to one side is noted. Posture is age-appropriate and stance is wide based. No problems turning are noted. Tandem walk is difficult initially. Intact toe on L, but cannot do it well on R. Unable to perform heel stance.             Assessment and Plan:    In summary, Crystal Buchanan is a very pleasant 52  y.o.-year old female with an underlying medical history of chronic low back pain and failed back surgery syndrome, on chronic narcotic pain medication with a history of OSA, on CPAP treatment at a pressure of 6 cwp. Her physical exam is stable and She indicates good results with the use of CPAP, and good tolerance of the pressure and mask. I reviewed the compliance data with the patient   and encouraged her to continue to use CPAP regularly to help reduce cardiovascular risk. While she does endorse restless legs syndrome type symptoms on the right side, I am reluctant to initiate any other new medication given her multiple sedating medications at this time. She is advised to get her iron status checks and we will draw blood today for this. She has a history of iron deficiency and was up to 3 pills a day of the iron supplement which now is once daily at this point. She may have to increase this and I am looking for a ferritin level above 50. If it is below 50 I am going to ask her to increase her ferrous sulfate. I talked to her at length about her sleep study results as well as her compliance data.  We also talked about trying to maintaining a healthy lifestyle in general. I encouraged the patient to eat healthy, exercise daily and keep well hydrated, to keep a scheduled bedtime and wake time routine, to not skip any meals and eat healthy snacks in between meals and to have protein with every meal. I stressed the importance of regular exercise.  Unfortunately, because of her back and leg pain she is quite limited in her exercise abilities. She may be seeing an orthopedic surgeon for her left knee and may need surgery for fibroids. She is advised to notify her GYN about her sleep apnea diagnosis and she may need to bring her CPAP machine to be able to use it in recovery. I answered all her questions today and the patient was in agreement with the above outlined plan. I would like to see the patient back in 6 months,  sooner if the need arises and encouraged her to call with any interim questions, concerns, problems or updates.

## 2013-11-08 NOTE — Patient Instructions (Addendum)
Please continue using your CPAP regularly. While your insurance requires that you use CPAP at least 4 hours each night on 70% of the nights, I recommend, that you not skip any nights and use it throughout the night if you can. Getting used to CPAP does take time and patience and discipline. Untreated obstructive sleep apnea when it is moderate to severe can have an adverse impact on cardiovascular health and raise her risk for heart disease, arrhythmias, hypertension, congestive heart failure, stroke and diabetes. Untreated obstructive sleep apnea causes sleep disruption, nonrestorative sleep, and sleep deprivation. This can have an impact on your day to day functioning and cause daytime sleepiness and impairment of cognitive function, memory loss, mood disturbance, and problems focussing. Using CPAP regularly can improve these symptoms. Keep up the good work! I will see you back in 6 months for sleep apnea check up, and if you continue to do well on CPAP I will see you once a year thereafter.  We are checking your iron status and will call you with the results and tell you, if you need to increase your iron supplement.

## 2013-11-09 ENCOUNTER — Encounter: Payer: Self-pay | Admitting: Neurology

## 2013-11-09 DIAGNOSIS — Z0289 Encounter for other administrative examinations: Secondary | ICD-10-CM

## 2013-11-09 LAB — IRON AND TIBC
IRON: 42 ug/dL (ref 35–155)
Iron Saturation: 13 % — ABNORMAL LOW (ref 15–55)
TIBC: 328 ug/dL (ref 250–450)
UIBC: 286 ug/dL (ref 150–375)

## 2013-11-09 LAB — FERRITIN: Ferritin: 12 ng/mL — ABNORMAL LOW (ref 15–150)

## 2013-11-09 NOTE — Progress Notes (Signed)
Quick Note:  Please call and advise patient that her iron studies indeed show that her storage iron is too low. This can cause additional restless leg symptoms. Please advise her to increase her iron supplement to one pill 3 times a day. She is taking one pill once daily but was up to 3 pills a day before. I think she has to go back to the 3 pills daily. Please advise her that taking iron pills with a little bit of orange juice helps with absorption through the vitamin C. Star Age, MD, PhD [facility]  ______

## 2013-11-10 ENCOUNTER — Telehealth: Payer: Self-pay

## 2013-11-10 NOTE — Telephone Encounter (Signed)
Message copied by [facility] on Thu Nov 10, 2013  8:36 AM ------      Message from: Star Age      Created: Wed Nov 09, 2013  2:54 PM       Please call and advise patient that her iron studies indeed show that her storage iron is too low. This can cause additional restless leg symptoms. Please advise her to increase her iron supplement to one pill 3 times a day. She is taking one pill once daily but was up to 3 pills a day before. I think she has to go back to the 3 pills daily. Please advise her that taking iron pills with a little bit of orange juice helps with absorption through the vitamin C.      Star Age, MD, PhD      [facility])       ------

## 2013-11-10 NOTE — Telephone Encounter (Signed)
I called patient and relayed the information provided by Dr. Rexene Alberts. I also reminded her to drink lots of water, eat lots from fresh fiber fruits and vegetables and exercise to keep her bowels moving. Patient understands.

## 2013-11-11 ENCOUNTER — Encounter: Payer: Self-pay | Admitting: Neurology

## 2013-11-11 NOTE — Progress Notes (Signed)
Quick Note:  I reviewed the patient's CPAP compliance data from 10/09/2013 to 11/07/2013, which is a total of 30 days, during which time the patient used CPAP every day except for 1 day. The average usage for all days was 5 hours and 22 minutes. The percent used days greater than 4 hours was 83 %, indicating very good compliance. The residual AHI was 0.4 per hour, indicating an appropriate treatment pressure of 6 cwp with EPR of 2. I will review this data with the patient at the next office visit, which is currently not scheduled as far as I can tell, I will provide feedback and additional troubleshooting if need be at the time of her appointment which I believe needs to be set up.  Star Age, MD, PhD [facility]   ______

## 2013-12-02 ENCOUNTER — Encounter (HOSPITAL_COMMUNITY): Payer: Self-pay | Admitting: Pharmacist

## 2013-12-12 ENCOUNTER — Encounter (HOSPITAL_COMMUNITY)
Admission: RE | Admit: 2013-12-12 | Discharge: 2013-12-12 | Disposition: A | Discharge status: Home / Self Care | Payer: BC Managed Care – PPO | Source: Ambulatory Visit | Attending: Obstetrics and Gynecology | Admitting: Obstetrics and Gynecology

## 2013-12-12 ENCOUNTER — Encounter (HOSPITAL_COMMUNITY): Payer: Self-pay

## 2013-12-12 DIAGNOSIS — Z01812 Encounter for preprocedural laboratory examination: Secondary | ICD-10-CM | POA: Insufficient documentation

## 2013-12-12 HISTORY — DX: Sleep apnea, unspecified: G47.30

## 2013-12-12 HISTORY — DX: Gastro-esophageal reflux disease without esophagitis: K21.9

## 2013-12-12 NOTE — Patient Instructions (Signed)
Crystal Buchanan  12/12/2013   Your procedure is scheduled on:  12/16/13  Enter through the Main Entrance of [facility] up the phone at the desk and dial [phone].   Call this number if you have problems the morning of surgery: [phone]   Remember:   Do not eat food:After Midnight.  Do not drink clear liquids: After Midnight.  Take these medicines the morning of surgery with A SIP OF WATER: Protonix    Do not wear jewelry, make-up or nail polish.  Do not wear lotions, powders, or perfumes. You may wear deodorant.  Do not shave 24 hours prior to surgery.  Do not bring valuables to the hospital.  [facility] is not   responsible for any belongings or valuables brought to the hospital.  Contacts, dentures or bridgework may not be worn into surgery.  Leave suitcase in the car. After surgery it may be brought to your room.  For patients admitted to the hospital, checkout time is 11:00 AM the day of              discharge.   Patients discharged the day of surgery will not be allowed to drive             home.  Name and phone number of your driver: [name]  Special Instructions:      Please read over the following fact sheets that you were given:   Surgical Site Infection Prevention

## 2013-12-13 LAB — CBC
HCT: 38.8 % (ref 36.0–46.0)
HEMOGLOBIN: 12.4 g/dL (ref 12.0–15.0)
MCH: 27.8 pg (ref 26.0–34.0)
MCHC: 32 g/dL (ref 30.0–36.0)
MCV: 87 fL (ref 78.0–100.0)
Platelets: 257 10*3/uL (ref 150–400)
RBC: 4.46 MIL/uL (ref 3.87–5.11)
RDW: 14.8 % (ref 11.5–15.5)
WBC: 6 10*3/uL (ref 4.0–10.5)

## 2013-12-15 NOTE — H&P (Addendum)
53 yo G3P3 with a cervical mass presents for surgical mngt  PMHx:  Back pain, leg spacity PSHx:  BTL Meds:  Robaxin, percocet, MTV, tizanidine, cymbalta All:  Codeine, lyrica, atorvastatin, gabapentin FHx: n/c Shx:  No tobacco  AF, VSS Gen - NAD Abd - soft, NT CV - RRR Lungs - clear PV - uterus mobile, NT.  2cm mass flush with right internal os  [name]:  Fibroids Pap neg  A/P:  Cervical mass, suspect fibroid Plan for resection of cervical mass R/b/a discussed, questions answered, informed consent

## 2013-12-16 ENCOUNTER — Encounter (HOSPITAL_COMMUNITY)
Admission: RE | Disposition: A | Discharge status: Home / Self Care | Payer: Self-pay | Source: Ambulatory Visit | Attending: Obstetrics and Gynecology

## 2013-12-16 ENCOUNTER — Encounter (HOSPITAL_COMMUNITY): Payer: BC Managed Care – PPO | Admitting: Anesthesiology

## 2013-12-16 ENCOUNTER — Ambulatory Visit (HOSPITAL_COMMUNITY)
Admission: RE | Admit: 2013-12-16 | Discharge: 2013-12-16 | Disposition: A | Discharge status: Home / Self Care | Payer: BC Managed Care – PPO | Source: Ambulatory Visit | Attending: Obstetrics and Gynecology | Admitting: Obstetrics and Gynecology

## 2013-12-16 ENCOUNTER — Encounter (HOSPITAL_COMMUNITY): Payer: Self-pay | Admitting: Anesthesiology

## 2013-12-16 ENCOUNTER — Ambulatory Visit (HOSPITAL_COMMUNITY): Payer: BC Managed Care – PPO | Admitting: Anesthesiology

## 2013-12-16 DIAGNOSIS — N9489 Other specified conditions associated with female genital organs and menstrual cycle: Secondary | ICD-10-CM | POA: Insufficient documentation

## 2013-12-16 DIAGNOSIS — N72 Inflammatory disease of cervix uteri: Secondary | ICD-10-CM | POA: Insufficient documentation

## 2013-12-16 DIAGNOSIS — N841 Polyp of cervix uteri: Secondary | ICD-10-CM | POA: Insufficient documentation

## 2013-12-16 DIAGNOSIS — G8929 Other chronic pain: Secondary | ICD-10-CM | POA: Insufficient documentation

## 2013-12-16 DIAGNOSIS — K219 Gastro-esophageal reflux disease without esophagitis: Secondary | ICD-10-CM | POA: Insufficient documentation

## 2013-12-16 HISTORY — PX: CERVICAL CONIZATION W/BX: SHX1330

## 2013-12-16 SURGERY — CONE BIOPSY, CERVIX
Anesthesia: General | Site: Vagina

## 2013-12-16 MED ORDER — MIDAZOLAM HCL 2 MG/2ML IJ SOLN
INTRAMUSCULAR | Status: AC
Start: 2013-12-16 — End: 2013-12-16
  Filled 2013-12-16: qty 2

## 2013-12-16 MED ORDER — PROPOFOL 10 MG/ML IV BOLUS
INTRAVENOUS | Status: DC | PRN
  Administered 2013-12-16: 200 mg via INTRAVENOUS

## 2013-12-16 MED ORDER — DEXTROSE 5 % IV SOLN
2.0000 g | INTRAVENOUS | Status: AC
  Administered 2013-12-16: 2 g via INTRAVENOUS
  Filled 2013-12-16: qty 2

## 2013-12-16 MED ORDER — LIDOCAINE HCL 1 % IJ SOLN
INTRAMUSCULAR | Status: AC
  Filled 2013-12-16: qty 20

## 2013-12-16 MED ORDER — LIDOCAINE-EPINEPHRINE 1 %-1:100000 IJ SOLN
INTRAMUSCULAR | Status: AC
  Filled 2013-12-16: qty 1

## 2013-12-16 MED ORDER — FENTANYL CITRATE 0.05 MG/ML IJ SOLN: 25.0000 ug | INTRAMUSCULAR | Status: DC | PRN

## 2013-12-16 MED ORDER — MEPERIDINE HCL 25 MG/ML IJ SOLN: 6.2500 mg | INTRAMUSCULAR | Status: DC | PRN

## 2013-12-16 MED ORDER — LIDOCAINE HCL (CARDIAC) 20 MG/ML IV SOLN
INTRAVENOUS | Status: AC
Start: 2013-12-16 — End: 2013-12-16
  Filled 2013-12-16: qty 5

## 2013-12-16 MED ORDER — DEXAMETHASONE SODIUM PHOSPHATE 10 MG/ML IJ SOLN
INTRAMUSCULAR | Status: DC | PRN
  Administered 2013-12-16: 10 mg via INTRAVENOUS

## 2013-12-16 MED ORDER — DEXAMETHASONE SODIUM PHOSPHATE 10 MG/ML IJ SOLN
INTRAMUSCULAR | Status: AC
  Filled 2013-12-16: qty 1

## 2013-12-16 MED ORDER — LIDOCAINE HCL (CARDIAC) 20 MG/ML IV SOLN
INTRAVENOUS | Status: DC | PRN
  Administered 2013-12-16: 60 mg via INTRAVENOUS

## 2013-12-16 MED ORDER — LIDOCAINE 1%/NA BICARB 0.1 MEQ INJECTION
INJECTION | INTRAVENOUS | Status: AC
  Filled 2013-12-16: qty 2

## 2013-12-16 MED ORDER — OXYCODONE-ACETAMINOPHEN 5-325 MG PO TABS: 1.0000 | ORAL_TABLET | ORAL | Status: DC | PRN

## 2013-12-16 MED ORDER — MIDAZOLAM HCL 5 MG/5ML IJ SOLN
INTRAMUSCULAR | Status: DC | PRN
Start: 2013-12-16 — End: 2013-12-16
  Administered 2013-12-16: 2 mg via INTRAVENOUS

## 2013-12-16 MED ORDER — FERRIC SUBSULFATE 259 MG/GM EX SOLN
CUTANEOUS | Status: AC
  Filled 2013-12-16: qty 8

## 2013-12-16 MED ORDER — LIDOCAINE HCL 1 % IJ SOLN
INTRAMUSCULAR | Status: DC | PRN
  Administered 2013-12-16: 10 mL

## 2013-12-16 MED ORDER — ONDANSETRON HCL 4 MG/2ML IJ SOLN
INTRAMUSCULAR | Status: AC
Start: 2013-12-16 — End: 2013-12-16
  Filled 2013-12-16: qty 2

## 2013-12-16 MED ORDER — MIDAZOLAM HCL 2 MG/2ML IJ SOLN: 0.5000 mg | Freq: Once | INTRAMUSCULAR | Status: DC | PRN

## 2013-12-16 MED ORDER — PROPOFOL 10 MG/ML IV EMUL
INTRAVENOUS | Status: AC
  Filled 2013-12-16: qty 20

## 2013-12-16 MED ORDER — ONDANSETRON HCL 4 MG/2ML IJ SOLN
INTRAMUSCULAR | Status: DC | PRN
  Administered 2013-12-16: 4 mg via INTRAVENOUS

## 2013-12-16 MED ORDER — LACTATED RINGERS IV SOLN
INTRAVENOUS | Status: DC
  Administered 2013-12-16 (×2): via INTRAVENOUS

## 2013-12-16 MED ORDER — FENTANYL CITRATE 0.05 MG/ML IJ SOLN
INTRAMUSCULAR | Status: DC | PRN
  Administered 2013-12-16 (×2): 50 ug via INTRAVENOUS

## 2013-12-16 MED ORDER — KETOROLAC TROMETHAMINE 30 MG/ML IJ SOLN: 15.0000 mg | Freq: Once | INTRAMUSCULAR | Status: DC | PRN

## 2013-12-16 MED ORDER — FENTANYL CITRATE 0.05 MG/ML IJ SOLN
INTRAMUSCULAR | Status: AC
  Filled 2013-12-16: qty 2

## 2013-12-16 MED ORDER — PROMETHAZINE HCL 25 MG/ML IJ SOLN: 6.2500 mg | INTRAMUSCULAR | Status: DC | PRN

## 2013-12-16 MED ORDER — IODINE STRONG (LUGOLS) 5 % PO SOLN
ORAL | Status: AC
  Filled 2013-12-16: qty 1

## 2013-12-16 MED ORDER — ACETIC ACID 5 % SOLN
Status: AC
  Filled 2013-12-16: qty 500

## 2013-12-16 MED ORDER — LIDOCAINE 1%/NA BICARB 0.1 MEQ INJECTION
INJECTION | INTRAVENOUS | Status: AC
  Filled 2013-12-16: qty 1

## 2013-12-16 MED ORDER — DOXYCYCLINE HYCLATE 100 MG PO CAPS
100.0000 mg | ORAL_CAPSULE | Freq: Two times a day (BID) | ORAL | Status: DC
Start: 2013-12-16 — End: 2014-02-17

## 2013-12-16 SURGICAL SUPPLY — 32 items
APPLICATOR COTTON TIP 6IN STRL (MISCELLANEOUS) IMPLANT
BLADE SURG 11 STRL SS (BLADE) ×2 IMPLANT
CANISTER SUCT 3000ML (MISCELLANEOUS) ×2 IMPLANT
CLOTH BEACON ORANGE TIMEOUT ST (SAFETY) ×2 IMPLANT
CONTAINER PREFILL 10% NBF 60ML (FORM) ×2 IMPLANT
COUNTER NEEDLE 1200 MAGNETIC (NEEDLE) IMPLANT
DECANTER SPIKE VIAL GLASS SM (MISCELLANEOUS) ×2 IMPLANT
DRSG TELFA 3X8 NADH (GAUZE/BANDAGES/DRESSINGS) ×2 IMPLANT
ELECT BALL LEEP 5MM RED (ELECTRODE) IMPLANT
ELECT BLADE 6 FLAT ULTRCLN (ELECTRODE) ×2 IMPLANT
ELECT REM PT RETURN 9FT ADLT (ELECTROSURGICAL) ×2
ELECTRODE REM PT RTRN 9FT ADLT (ELECTROSURGICAL) ×1 IMPLANT
GLOVE BIO SURGEON STRL SZ 6.5 (GLOVE) ×2 IMPLANT
GLOVE BIOGEL PI IND STRL 7.0 (GLOVE) ×1 IMPLANT
GLOVE BIOGEL PI INDICATOR 7.0 (GLOVE) ×1
GOWN STRL REUS W/TWL LRG LVL3 (GOWN DISPOSABLE) ×4 IMPLANT
NEEDLE SPNL 22GX3.5 QUINCKE BK (NEEDLE) ×2 IMPLANT
NS IRRIG 1000ML POUR BTL (IV SOLUTION) IMPLANT
PACK VAGINAL MINOR WOMEN LF (CUSTOM PROCEDURE TRAY) ×2 IMPLANT
PAD OB MATERNITY 4.3X12.25 (PERSONAL CARE ITEMS) ×2 IMPLANT
PENCIL BUTTON HOLSTER BLD 10FT (ELECTRODE) IMPLANT
SCOPETTES 8  STERILE (MISCELLANEOUS)
SCOPETTES 8 STERILE (MISCELLANEOUS) IMPLANT
SPONGE SURGIFOAM ABS GEL 12-7 (HEMOSTASIS) ×2 IMPLANT
SUT VIC AB 0 CT1 27 (SUTURE) ×1
SUT VIC AB 0 CT1 27XBRD ANBCTR (SUTURE) ×1 IMPLANT
SUT VIC AB 2-0 CT1 (SUTURE) ×4 IMPLANT
SYR CONTROL 10ML LL (SYRINGE) ×2 IMPLANT
TOWEL OR 17X24 6PK STRL BLUE (TOWEL DISPOSABLE) ×4 IMPLANT
TUBING NON-CON 1/4 X 20 CONN (TUBING) IMPLANT
WATER STERILE IRR 1000ML POUR (IV SOLUTION) ×2 IMPLANT
YANKAUER SUCT BULB TIP NO VENT (SUCTIONS) IMPLANT

## 2013-12-16 NOTE — Anesthesia Postprocedure Evaluation (Signed)
  Anesthesia Post-op Note  Patient: Crystal Buchanan  Procedure(s) Performed: Procedure(s) with comments: resection of cervical mass (N/A) - resection of cx mass-MD wants to use cold knife cone instrruments for this case Patient is awake and responsive. Pain and nausea are reasonably well controlled. Vital signs are stable and clinically acceptable. Oxygen saturation is clinically acceptable. There are no apparent anesthetic complications at this time. Patient is ready for discharge.

## 2013-12-16 NOTE — Discharge Instructions (Signed)
FU office 2-3 weeks for postop appointment.  Call the office [phone] for an appointment. ° °Personal Hygiene: °Use pads not tampons x 1week °You may shower, no tub baths or pools for 2-3 weeks °Wipe from front to back when using restroom ° °Activity: °Do not drive or operate any equipment for 24 hrs.   °Do not rest in bed all day °Walking is encouraged °Walk up and down stairs slowly °You may return to your normal activity in 1-2 days ° °Sexual Activity:  No intercourse for 2 weeks after the procedure. ° °Diet: Eat a light meal as desired this evening.  You may resume your usual diet tomorrow. ° °Return to work:  You may resume your work activities after 1-2 days ° °What to expect:  Expect to have vaginal bleeding/discharge for 2-3 days and spotting for 10-14 days.  It is not unusual to have soreness for 1-2 weeks.  You may have a slight burning sensation when you urinate for the first few days.  You may start your menses in 2-6 weeks.  Mild cramps may continue for a couple of days.   ° °Call your doctor:   °Excessive bleeding, saturating a pad every hour °Inability to urinate 6 hours after discharge °Pain not relieved with pain medications °Fever of 100.4 or greater ° ° °Post Anesthesia Home Care Instructions ° °Activity: °Get plenty of rest for the remainder of the day. A responsible adult should stay with you for 24 hours following the procedure.  °For the next 24 hours, DO NOT: °-Drive a car °-Operate machinery °-Drink alcoholic beverages °-Take any medication unless instructed by your physician °-Make any legal decisions or sign important papers. ° °Meals: °Start with liquid foods such as gelatin or soup. Progress to regular foods as tolerated. Avoid greasy, spicy, heavy foods. If nausea and/or vomiting occur, drink only clear liquids until the nausea and/or vomiting subsides. Call your physician if vomiting continues. ° °Special Instructions/Symptoms: °Your throat may feel dry or sore from the anesthesia or  the breathing tube placed in your throat during surgery. If this causes discomfort, gargle with warm salt water. The discomfort should disappear within 24 hours. ° °

## 2013-12-16 NOTE — Transfer of Care (Signed)
Immediate Anesthesia Transfer of Care Note  Patient: Crystal Buchanan  Procedure(s) Performed: Procedure(s) with comments: resection of cervical mass (N/A) - resection of cx mass-MD wants to use cold knife cone instrruments for this case  Patient Location: PACU  Anesthesia Type:General  Level of Consciousness: sedated  Airway & Oxygen Therapy: Patient Spontanous Breathing and Patient connected to nasal cannula oxygen  Post-op Assessment: Report given to PACU RN and Post -op Vital signs reviewed and stable  Post vital signs: stable  Complications: No apparent anesthesia complications

## 2013-12-16 NOTE — Anesthesia Preprocedure Evaluation (Addendum)
Anesthesia Evaluation  Patient identified by MRN, date of birth, ID band Patient awake    Reviewed: Allergy & Precautions, H&P , Patient's Chart, lab work & pertinent test results, reviewed documented beta blocker date and time   History of Anesthesia Complications Negative for: history of anesthetic complications  Airway Mallampati: II TM Distance: >3 FB Neck ROM: full    Dental   Pulmonary  breath sounds clear to auscultation        Cardiovascular Exercise Tolerance: Good Rhythm:regular Rate:Normal     Neuro/Psych  Headaches, negative psych ROS   GI/Hepatic GERD-  Controlled,  Endo/Other    Renal/GU      Musculoskeletal   Abdominal   Peds  Hematology   Anesthesia Other Findings Chronic pain- no narcotics for approximately 1 month  Reproductive/Obstetrics                          Anesthesia Physical Anesthesia Plan  ASA: III  Anesthesia Plan: General LMA   Post-op Pain Management:    Induction:   Airway Management Planned:   Additional Equipment:   Intra-op Plan:   Post-operative Plan:   Informed Consent: I have reviewed the patients History and Physical, chart, labs and discussed the procedure including the risks, benefits and alternatives for the proposed anesthesia with the patient or authorized representative who has indicated his/her understanding and acceptance.   Dental Advisory Given  Plan Discussed with: CRNA, Surgeon and Anesthesiologist  Anesthesia Plan Comments:       position awake  Anesthesia Quick Evaluation

## 2013-12-16 NOTE — Op Note (Signed)
NAME:  Crystal Buchanan, Crystal Buchanan               ACCOUNT NO.:  [id number]  MEDICAL RECORD NO.:  [id number]  LOCATION:  [facility]                          FACILITY:  [facility]  PHYSICIAN:  Marylynn Pearson, MD    DATE OF BIRTH:  [birth year]  DATE OF PROCEDURE:  12/16/2013 DATE OF DISCHARGE:                              OPERATIVE REPORT   PREOPERATIVE DIAGNOSIS:  Cervical mass.  POSTOPERATIVE DIAGNOSIS:  Suspect polypoid abscess.  SURGEON:  Marylynn Pearson, MD  ASSISTANT:  None.  ANESTHESIA:  General.  SPECIMENS:  Cervical mass.  BLOOD LOSS:  Less than 100 mL.  COMPLICATIONS:  None.  CONDITION:  Stable to recovery room.  PROCEDURE:  The patient was taken to the operating room.  After informed consent was obtained, she was given general anesthesia, placed in the dorsal lithotomy position using Allen stirrups.  She was prepped and draped in sterile fashion.  Bivalve speculum was placed in the vagina and a cervical mass was noted.  It appeared to be a polyp that was adherent to the cervix from the 9 o' clock to 6 o' clock position.  The polypoid mass was grasped with ring forceps, and this caused leaking of purulent material from the mass.  Scalpel was then used to resect the mass from the adherent portions of the cervix from 9 o'clock to 6 o' clock.  The base of the mass was then excised using curved Mayo scissors.  Ring forceps were then used to grasp the base of the mass. Again, this was twisted in a clockwise fashion and removed.  Hemostasis was then achieved with the Bovie and Surgicel.  A stitch was placed in the cervix from 3 o'clock to 9 o'clock to hold the Surgicel in place. Hemostasis was achieved.  A 1% lidocaine was then injected to provide local anesthesia.  The cervix was hemostatic. Speculum was removed.  The patient was extubated and taken to the recovery room in stable condition.  Sponge, lap, needle, and instrument counts were correct x2.     Marylynn Pearson, MD     GA/MEDQ   D:  12/16/2013  T:  12/16/2013  Job:  [id number]

## 2013-12-19 ENCOUNTER — Encounter (HOSPITAL_COMMUNITY): Payer: Self-pay | Admitting: Obstetrics and Gynecology

## 2013-12-22 ENCOUNTER — Telehealth: Payer: Self-pay | Admitting: Neurology

## 2013-12-22 NOTE — Telephone Encounter (Signed)
[name] from [facility] calling to check the status on some forms that patient submitted to be filled out. Please call her back or the forms can be faxed over to [phone].

## 2013-12-22 NOTE — Telephone Encounter (Signed)
Called [name] from [facility] to explain to her that the physicians here do not fill out social security disability form, that the office fax over the last office visit note. [name] stated that she would let the patient know. I advised [name] that if the patient would like to come to the office to discuss the situation further she could. [name] verbalized understanding.

## 2014-02-17 ENCOUNTER — Telehealth: Payer: Self-pay

## 2014-02-17 NOTE — Telephone Encounter (Signed)
Medication and allergies:  Reviewed and updated  90 day supply/mail order: n/a Local pharmacy:  [facility], [place] - [address]   Immunizations due:  Td/Tdap-due   A/P: No changes to personal, family history or past surgical hx PAP- 06/22/13- normal CCS- has never had one MMG- 03/03/13- possible left breast mass; 03/24/13- negative Flu- did not receive Tdap- unsure of last vaccine  To Discuss with Provider: Nothing at this time.

## 2014-02-20 ENCOUNTER — Encounter: Payer: Self-pay | Admitting: Family Medicine

## 2014-02-20 ENCOUNTER — Encounter: Payer: Self-pay | Admitting: General Practice

## 2014-02-20 ENCOUNTER — Ambulatory Visit (INDEPENDENT_AMBULATORY_CARE_PROVIDER_SITE_OTHER): Payer: 59 | Admitting: Family Medicine

## 2014-02-20 VITALS — BP 118/80 | HR 80 | Temp 98.2°F | Resp 16 | Ht 68.0 in | Wt 213.0 lb

## 2014-02-20 DIAGNOSIS — Z0000 Encounter for general adult medical examination without abnormal findings: Secondary | ICD-10-CM

## 2014-02-20 LAB — CBC WITH DIFFERENTIAL/PLATELET
Basophils Absolute: 0 10*3/uL (ref 0.0–0.1)
Basophils Relative: 0.4 % (ref 0.0–3.0)
EOS ABS: 0.3 10*3/uL (ref 0.0–0.7)
Eosinophils Relative: 4 % (ref 0.0–5.0)
HCT: 40.8 % (ref 36.0–46.0)
Hemoglobin: 13.2 g/dL (ref 12.0–15.0)
Lymphs Abs: 3.9 10*3/uL (ref 0.7–4.0)
MCHC: 32.5 g/dL (ref 30.0–36.0)
MCV: 86.4 fl (ref 78.0–100.0)
MONO ABS: 0.3 10*3/uL (ref 0.1–1.0)
Monocytes Relative: 5 % (ref 3.0–12.0)
NEUTROS PCT: 34.2 % — AB (ref 43.0–77.0)
Neutro Abs: 2.4 10*3/uL (ref 1.4–7.7)
PLATELETS: 248 10*3/uL (ref 150.0–400.0)
RBC: 4.72 Mil/uL (ref 3.87–5.11)
RDW: 17.7 % — ABNORMAL HIGH (ref 11.5–14.6)
WBC: 6.9 10*3/uL (ref 4.5–10.5)

## 2014-02-20 LAB — BASIC METABOLIC PANEL
BUN: 14 mg/dL (ref 6–23)
CALCIUM: 9.4 mg/dL (ref 8.4–10.5)
CHLORIDE: 105 meq/L (ref 96–112)
CO2: 27 meq/L (ref 19–32)
Creatinine, Ser: 0.8 mg/dL (ref 0.4–1.2)
GFR: 97.16 mL/min (ref >60.00)
GLUCOSE: 106 mg/dL — AB (ref 70–99)
Potassium: 3.8 mEq/L (ref 3.5–5.1)
Sodium: 139 mEq/L (ref 135–145)

## 2014-02-20 LAB — TSH: TSH: 1.78 u[IU]/mL (ref 0.35–5.50)

## 2014-02-20 LAB — LIPID PANEL
CHOL/HDL RATIO: 3
Cholesterol: 167 mg/dL (ref 0–200)
HDL: 63 mg/dL (ref >39.00)
LDL Cholesterol: 78 mg/dL (ref 0–99)
TRIGLYCERIDES: 130 mg/dL (ref 0.0–149.0)
VLDL: 26 mg/dL (ref 0.0–40.0)

## 2014-02-20 LAB — HEPATIC FUNCTION PANEL
ALT: 14 U/L (ref 0–35)
AST: 19 U/L (ref 0–37)
Albumin: 4.1 g/dL (ref 3.5–5.2)
Alkaline Phosphatase: 58 U/L (ref 39–117)
BILIRUBIN TOTAL: 0.5 mg/dL (ref 0.2–1.2)
Bilirubin, Direct: 0 mg/dL (ref 0.0–0.3)
TOTAL PROTEIN: 7.6 g/dL (ref 6.0–8.3)

## 2014-02-20 MED ORDER — ALPRAZOLAM 0.5 MG PO TABS: ORAL_TABLET | ORAL | Status: DC

## 2014-02-20 NOTE — Patient Instructions (Signed)
Follow up in 6 months to recheck cholesterol We'll notify you of your lab results and make any changes if needed Increase your water intake Call with any questions or concerns [place]!!!

## 2014-02-20 NOTE — Progress Notes (Signed)
   Subjective:    Patient ID: Crystal Buchanan, female    DOB: 22-Sep-1961, 53 y.o.   MRN: [id number]  HPI CPE- UTD on mammo, pap.  Has never had a colonoscopy but pt reports 'not right now' due to ongoing back issues.  Chronic back pain- went to see spine surgeon at [facility] and was told she had inflammation in both the back and the knees.  Surgeon not interested in additional back surgery.  Seeing Dr Brien Few for pain management.   Review of Systems Patient reports no vision/ hearing changes, adenopathy,fever, weight change,  persistant/recurrent hoarseness , swallowing issues, chest pain, palpitations, edema, persistant/recurrent cough, hemoptysis, dyspnea (rest/exertional/paroxysmal nocturnal), gastrointestinal bleeding (melena, rectal bleeding), abdominal pain, bowel changes, GU symptoms (dysuria, hematuria, incontinence), Gyn symptoms (abnormal  bleeding, pain),  syncope, memory loss, skin/hair/nail changes, abnormal bruising or bleeding, anxiety, or depression.  + GERD- improved w/ protonix     Objective:   Physical Exam General Appearance:    Alert, cooperative, no distress, appears stated age  Head:    Normocephalic, without obvious abnormality, atraumatic  Eyes:    PERRL, conjunctiva/corneas clear, EOM's intact, fundi    benign, both eyes  Ears:    Normal TM's and external ear canals, both ears  Nose:   Nares normal, septum midline, mucosa normal, no drainage    or sinus tenderness  Throat:   Lips, mucosa, and tongue normal; teeth and gums normal  Neck:   Supple, symmetrical, trachea midline, no adenopathy;    Thyroid: no enlargement/tenderness/nodules  Back:     Symmetric, no curvature, ROM normal, no CVA tenderness  Lungs:     Clear to auscultation bilaterally, respirations unlabored  Chest Wall:    No tenderness or deformity   Heart:    Regular rate and rhythm, S1 and S2 normal, no murmur, rub   or gallop  Breast Exam:    Deferred to GYN  Abdomen:     Soft, non-tender, bowel  sounds active all four quadrants,    no masses, no organomegaly  Genitalia:    Deferred to GYN  Rectal:    Extremities:   Extremities normal, atraumatic, no cyanosis or edema  Pulses:   2+ and symmetric all extremities  Skin:   Skin color, texture, turgor normal, no rashes or lesions  Lymph nodes:   Cervical, supraclavicular, and axillary nodes normal  Neurologic:   CNII-XII intact          Assessment & Plan:

## 2014-02-20 NOTE — Assessment & Plan Note (Signed)
Pt's PE WNL w/ exception of known back and knee issues.  Pt following w/ ortho, neurosurg, and pain management.  UTD on pap, mammo.  Due for colonoscopy but pt would like to defer until back pain is better controlled.  Check labs.  Anticipatory guidance provided.

## 2014-02-20 NOTE — Progress Notes (Signed)
Pre visit review using our clinic review tool, if applicable. No additional management support is needed unless otherwise documented below in the visit note. 

## 2014-02-26 LAB — VITAMIN D 1,25 DIHYDROXY
VITAMIN D 1, 25 (OH) TOTAL: 48 pg/mL (ref 18–72)
VITAMIN D3 1, 25 (OH): 48 pg/mL

## 2014-02-27 ENCOUNTER — Encounter: Payer: Self-pay | Admitting: General Practice

## 2014-02-28 ENCOUNTER — Telehealth: Payer: Self-pay | Admitting: Family Medicine

## 2014-02-28 NOTE — Telephone Encounter (Signed)
Caller name:Vihana Highley  Relation to MP:NTIRWER Call back number: [phone] Pharmacy:  Reason for call: patient called to see was an order placed in for her to have a ultra sound done regarding her TSH. Please advise.

## 2014-03-01 NOTE — Telephone Encounter (Signed)
Pt.notified

## 2014-03-01 NOTE — Telephone Encounter (Signed)
TSH was normal, no reason for [name] at this time.  If pt has concerns, will need OV

## 2014-03-01 NOTE — Telephone Encounter (Signed)
No notation of pt getting an [name] on her thyroid. Please advise.

## 2014-04-28 ENCOUNTER — Telehealth: Payer: Self-pay | Admitting: *Deleted

## 2014-04-28 MED ORDER — ROSUVASTATIN CALCIUM 20 MG PO TABS: 20.0000 mg | ORAL_TABLET | Freq: Every day | ORAL | Status: DC

## 2014-04-28 NOTE — Telephone Encounter (Signed)
Caller name:  Aftin Relation to pt:  self Call back number: [phone]  Pharmacy:  [facility], [place] [address]  Reason for call: Pt came by requesting refill on  rosuvastatin (CRESTOR) 20 MG tablet  Last filled 10/31/13, #30, 3 refills Last OV 02/20/2014

## 2014-05-01 ENCOUNTER — Telehealth: Payer: Self-pay | Admitting: *Deleted

## 2014-05-01 NOTE — Telephone Encounter (Signed)
Pt brought in a letter informing Dr. [name] that she has applied for [place] she is needing a letter from her primary doctor on her behalf.  Placed in the folder for Dr. [name] to review.//AB/CMA

## 2014-05-02 ENCOUNTER — Encounter: Payer: Self-pay | Admitting: Family Medicine

## 2014-05-02 DIAGNOSIS — Z0279 Encounter for issue of other medical certificate: Secondary | ICD-10-CM

## 2014-05-02 NOTE — Telephone Encounter (Signed)
Received signed letter for Disability from Dr. [name].  Called and informed the pt that the letter is ready, but informed her that Dr. [name] stated that this is the only documentation she will provide or sign since she is not a disability MD.  Pt stated okay and that she was doing for the disability people asked her to do and that was to have her doctors write a letter.    Informed her that I will place the letter up front for her to pick-up.  Pt understood and agreed.//AB/CMA

## 2014-05-08 ENCOUNTER — Ambulatory Visit: Payer: 59 | Admitting: Neurology

## 2014-05-11 ENCOUNTER — Ambulatory Visit (INDEPENDENT_AMBULATORY_CARE_PROVIDER_SITE_OTHER): Payer: 59 | Admitting: Neurology

## 2014-05-11 ENCOUNTER — Encounter: Payer: Self-pay | Admitting: Neurology

## 2014-05-11 VITALS — BP 123/80 | HR 79 | Temp 97.5°F | Ht 68.0 in | Wt 208.0 lb

## 2014-05-11 DIAGNOSIS — Z9989 Dependence on other enabling machines and devices: Principal | ICD-10-CM

## 2014-05-11 DIAGNOSIS — E669 Obesity, unspecified: Secondary | ICD-10-CM

## 2014-05-11 DIAGNOSIS — M549 Dorsalgia, unspecified: Secondary | ICD-10-CM

## 2014-05-11 DIAGNOSIS — G4733 Obstructive sleep apnea (adult) (pediatric): Secondary | ICD-10-CM

## 2014-05-11 DIAGNOSIS — G8929 Other chronic pain: Secondary | ICD-10-CM

## 2014-05-11 NOTE — Patient Instructions (Signed)

## 2014-05-11 NOTE — Progress Notes (Signed)
Subjective:    Patient ID: Crystal Buchanan is a 53 y.o. female.  HPI    Interim history:   Crystal Buchanan is a very pleasant 53 year old right-handed woman with an underlying medical history of chronic low back pain and failed back surgery syndrome, on chronic narcotic pain medication, who presents for followup consultation of her obstructive sleep apnea treated with CPAP. The patient is unaccompanied today. I last saw her on 11/08/2013, at which time I reviewed her compliance data and her sleep study results. She reported sleeping better, with more consolidated sleep, no residual snoring, and improved restlessness. She had an epidural injection under Dr. Brien Few.  She endorses restless leg symptoms and has a history of iron deficiency. I checked iron studies and we called her back with the results. She reported needing surgery for her left knee and surgery for fibroids. Her ferritin was 12 and she was advised to increase her iron pills to one pill 3 times a day from twice daily. In the interim, on 12/16/2013 she had resection of a cervical mass.  I reviewed her compliance data from 04/03/2014 through 05/02/2014, which is a total of 30 days during which time she is CPAP every night except for 2 nights, percent used days greater than 4 hours was 90%, average usage of 5 hours, AHI residual at 0.3 per hour, at goal, and leak was low at 1.1 L per minute for the 95th percentile, pressure at 6 cm with EPR of 2.  Today, she reports feeling stable, but has a congestion. Generally speaking, she is feeling better with CPAP. She was started on xanax by Dr. [name] for anxiety. She is on 0.5 mg 1/2 to 1 pill bid. She has fallen recently due to R knee pain and it gave out. She has pain procedure planned (spinal cord stimulator?).  I first met her on 06/23/2013 at the request of her pain management doctor, Dr. Brien Few. She had a home sleep test in his office showing an AHI of 17.2 per hour. I suggested that she return for  sleep study. She had a baseline sleep study on 07/10/2013 and I went over her test results with her in detail today. I also explained to her her second sleep study which was a CPAP titration study on 08/23/2013. Her baseline sleep study from 07/10/2013 showed a sleep efficiency of 41.3% only. She had a markedly prolonged sleep latency at 116 minutes and a high amount of wake after sleep onset at 158 minutes with moderate sleep fragmentation noted. She had increased percentage of light stage sleep, decreased percentage of slow-wave sleep and a decreased percentage of REM sleep was highly prolonged REM latency. She had mild intermittent snoring. She only slept on her sides. She had a total AHI of 10.6 per hour, rising to 13.8 per hour and REM sleep. Her baseline oxygen saturation was 93% on her nadir was 87%. Her CPAP titration study from 08/23/2013 showed a sleep efficiency of 32.5% only. Sleep latency was 62 minutes and wake after sleep onset was high at 194.5 minutes with mild sleep fragmentation noted and she was essentially unable to achieve sleep after 3 AM. Her arousal index was normal. She had an increased percentage of light stage sleep, absence of deep sleep, and a decreased percentage of REM sleep with prolonged REM latency. Her baseline oxygen saturation was 94%, her nadir was 89%. CPAP was initiated at 5 cm and titrated to 6 cm on which were AHI was 0.7 per hour. She did  not have any significant periodic leg movements of sleep in either study. Based on the test results I suggested she start CPAP treatment at 6 cm of water pressure.  I reviewed her compliance data from 10/04/2013 through 11/02/2013 which is a total of 30 days during which time she used CPAP every night except for 2 nights. Percent used days greater than 4 hours was 93%, indicating excellent compliance. Her pressure is 6 cm with a PR of 2, her average usage was 5 hours and 35 minutes, her residual AHI is low at 0.5 per hour, and her leak  was very low.   Her Past Medical History Is Significant For: Past Medical History  Diagnosis Date  . Chicken pox   . Migraine   . Thyroid disease   . Hyperlipidemia   . Sleep apnea   . GERD (gastroesophageal reflux disease)     Her Past Surgical History Is Significant For: Past Surgical History  Procedure Laterality Date  . Lumbar disc surgery  02-26-12  . Knee arthroscopy  1998  . Breast surgery    . Cervical conization w/bx N/A 12/16/2013    Procedure: resection of cervical mass;  Surgeon: Crystal Pearson, MD;  Location: [facility];  Service: Gynecology;  Laterality: N/A;  resection of cx mass-MD wants to use cold knife cone instrruments for this case    Her Family History Is Significant For: Family History  Problem Relation Age of Onset  . Heart disease Mother   . Arthritis Father   . Hyperlipidemia Father   . Heart disease Father   . Stroke Father   . Hypertension Father   . Diabetes Father   . Diabetes Mother   . Hypertension Mother     Her Social History Is Significant For: History   Social History  . Marital Status: Married    Spouse Name: N/A    Number of Children: 3  . Years of Education: 12th   Occupational History  . N/A    Social History Main Topics  . Smoking status: Never Smoker   . Smokeless tobacco: None  . Alcohol Use: No  . Drug Use: No  . Sexual Activity: None   Other Topics Concern  . None   Social History Narrative  . None    Her Allergies Are:  Allergies  Allergen Reactions  . Atorvastatin Nausea Only    Along with stomachaches  . Lyrica [Pregabalin]     Dizziness,blurred vision,increased appetite,h/a  . Codeine Palpitations  . Gabapentin Rash    Itching and nausa  :   Her Current Medications Are:  Outpatient Encounter Prescriptions as of 05/11/2014  Medication Sig  . ALPRAZolam (XANAX) 0.5 MG tablet 1/2-1 tab BID prn for anxiety  . DULoxetine (CYMBALTA) 30 MG capsule Take 30 mg by mouth daily.  . ferrous sulfate 325 (65  FE) MG tablet Take 325 mg by mouth 2 (two) times daily.  . methocarbamol (ROBAXIN) 500 MG tablet Take 500 mg by mouth daily.  . naproxen (NAPROSYN) 500 MG tablet Take 500 mg by mouth 2 (two) times daily with a meal.  . oxyCODONE-acetaminophen (PERCOCET/ROXICET) 5-325 MG per tablet Take 1 tablet by mouth every 6 (six) hours as needed for pain.  . pantoprazole (PROTONIX) 40 MG tablet Take 1 tablet (40 mg total) by mouth daily.  . rosuvastatin (CRESTOR) 20 MG tablet Take 1 tablet (20 mg total) by mouth daily.  Marland Kitchen tiZANidine (ZANAFLEX) 2 MG tablet Take 2 mg by mouth every 6 (six)  hours as needed.  :  Review of Systems:  Out of a complete 14 point review of systems, all are reviewed and negative with the exception of these symptoms as listed below:  Review of Systems  Constitutional: Positive for appetite change.  Eyes: Positive for redness.  Respiratory: Negative.   Cardiovascular: Negative.   Gastrointestinal: Negative.   Endocrine: Positive for polyphagia.  Genitourinary: Positive for frequency.  Musculoskeletal: Positive for arthralgias, back pain and neck stiffness.       Muscle cramps  Skin: Negative.   Allergic/Immunologic: Negative.   Neurological: Positive for weakness, numbness and headaches.       Memory loss  Hematological:       Anemia  Psychiatric/Behavioral: Positive for sleep disturbance (restless leg, insomnia, snoring). The patient is nervous/anxious.     Objective:  Neurologic Exam  Physical Exam Physical Examination:   Filed Vitals:   05/11/14 1219  BP: 123/80  Pulse: 79  Temp: 97.5 F (36.4 C)    General Examination: The patient is a very pleasant 53 y.o. female in no acute distress. She appears well-developed and well-nourished and well groomed. She is overweight.  HEENT: Normocephalic, atraumatic, pupils are equal, round and reactive to light and accommodation. Funduscopic exam is normal with sharp disc margins noted. Extraocular tracking is good without  limitation to gaze excursion or nystagmus noted. Normal smooth pursuit is noted. Hearing is grossly intact. Tympanic membranes are clear bilaterally. Face is symmetric with normal facial animation and normal facial sensation. Speech is clear with no dysarthria noted. There is no hypophonia. There is no lip, neck/head, jaw or voice tremor. Neck is supple with full range of passive and active motion. There are no carotid bruits on auscultation. Oropharynx exam reveals: mild mouth dryness, good dental hygiene and moderate airway crowding, due to redundant soft palate and tonsillar size of 1-2+. Mallampati is class III. Tongue protrudes centrally and palate elevates symmetrically. Neck size is mildly enlarged.    Chest: Clear to auscultation without wheezing, rhonchi or crackles noted.  Heart: S1+S2+0, regular and normal without murmurs, rubs or gallops noted.   Abdomen: Soft, non-tender and non-distended with normal bowel sounds appreciated on auscultation.  Extremities: There is no pitting edema in the distal lower extremities bilaterally. Pedal pulses are intact. Mild, non-pitting puffiness is noted in the R ankle.   Skin: Warm and dry without trophic changes noted. There are no varicose veins.  Musculoskeletal: exam reveals no obvious joint deformities, tenderness or joint swelling or erythema, except for lower and midback pain, more on the L.   Neurologically:  Mental status: The patient is awake, alert and oriented in all 4 spheres. Her memory, attention, language and knowledge are appropriate. There is no aphasia, agnosia, apraxia or anomia. Speech is clear with normal prosody and enunciation. Thought process is linear. Mood is congruent and affect is normal.  Cranial nerves are as described above under HEENT exam. In addition, shoulder shrug is normal with equal shoulder height noted. Motor exam: Normal bulk, strength and tone is noted. There is no drift, tremor or rebound. Romberg is negative.  Reflexes are 2+ throughout. Toes are downgoing bilaterally. Fine motor skills are intact with normal finger taps, normal hand movements, normal rapid alternating patting, normal foot taps and normal foot agility.  Cerebellar testing shows no dysmetria or intention tremor on finger to nose testing. Heel to shin is unremarkable bilaterally. There is no truncal or gait ataxia.  Sensory exam is intact to light touch, pinprick, vibration,  temperature sense in the upper and lower extremities with the exception of decrease in PP and temperature sense in the R leg distal to the knee and in the R hamstring area.  Gait, station and balance: she stands up with mild difficulty and has to push herself up. She walks with a limp on the R. She brought her cane. No veering to one side is noted. No leaning to one side is noted. Posture is age-appropriate and stance is wide based. No problems turning are noted. Tandem walk is difficult initially. Intact toe on L, but cannot do it well on R. Unable to perform heel stance.             Assessment and Plan:   In summary, Kamrynn Melott is a very pleasant 53 year old female with an underlying medical history of chronic low back pain and failed back surgery syndrome, on chronic narcotic pain medication with a history of OSA, on CPAP treatment at a pressure of 6 cwp. Her physical exam is stable and she indicates good results with the use of CPAP, and good tolerance of the pressure and mask. I reviewed the latest compliance data with the patient and encouraged her to continue to use CPAP regularly to help reduce cardiovascular risk. I again reviewed her sleep study results as well as her compliance data.  We also talked about trying to maintaining a healthy lifestyle in general. I encouraged the patient to eat healthy, exercise daily and keep well hydrated, to keep a scheduled bedtime and wake time routine, to not skip any meals and eat healthy snacks in between meals and to have  protein with every meal. I stressed the importance of regular exercise.  Unfortunately, because of her back and leg pain she is quite limited in her exercise abilities. She has had cervical mass removed recently in 2/15. She has seen Dr. Sherlyn Lick for her R knee. I answered all her questions today and the patient was in agreement with the above outlined plan. I would like to see the patient back in 12 months, sooner if the need arises and encouraged her to call with any interim questions, concerns, problems or updates.

## 2014-05-25 NOTE — Telephone Encounter (Signed)
Noted  

## 2014-08-14 ENCOUNTER — Other Ambulatory Visit: Payer: Self-pay

## 2014-08-14 DIAGNOSIS — Z1231 Encounter for screening mammogram for malignant neoplasm of breast: Secondary | ICD-10-CM

## 2014-08-31 ENCOUNTER — Ambulatory Visit
Admission: RE | Admit: 2014-08-31 | Discharge: 2014-08-31 | Disposition: A | Discharge status: Home / Self Care | Payer: 59 | Source: Ambulatory Visit

## 2014-08-31 DIAGNOSIS — Z1231 Encounter for screening mammogram for malignant neoplasm of breast: Secondary | ICD-10-CM

## 2014-10-17 ENCOUNTER — Telehealth: Payer: Self-pay | Admitting: *Deleted

## 2014-10-17 MED ORDER — ALPRAZOLAM 0.5 MG PO TABS
ORAL_TABLET | ORAL | Status: DC
Start: 2014-10-17 — End: 2016-08-04

## 2014-10-17 NOTE — Telephone Encounter (Signed)
Med filled and faxed.  

## 2014-10-17 NOTE — Telephone Encounter (Signed)
Ok for #60, 1 refill 

## 2014-10-17 NOTE — Telephone Encounter (Signed)
Medication Detail      Disp Refills Start End     ALPRAZolam (XANAX) 0.5 MG tablet 60 tablet 1 02/20/2014     Sig: 1/2-1 tab BID prn for anxiety    Class: Print    Faxed refill request received from [facility] for Alprazolam 0.5 mg Last filled by MD on 05.04.15, #[id number] Last AEX - 05.04.15 Next AEX - 6-Mths [No future appointments scheduled] Please Advise on refills/SLS

## 2015-05-15 ENCOUNTER — Ambulatory Visit: Payer: 59 | Admitting: Neurology

## 2015-05-15 ENCOUNTER — Encounter: Payer: Self-pay | Admitting: Neurology

## 2015-05-15 ENCOUNTER — Ambulatory Visit (INDEPENDENT_AMBULATORY_CARE_PROVIDER_SITE_OTHER): Payer: 59 | Admitting: Neurology

## 2015-05-15 VITALS — BP 144/92 | HR 78 | Resp 16 | Ht 68.0 in | Wt 219.0 lb

## 2015-05-15 DIAGNOSIS — M79671 Pain in right foot: Secondary | ICD-10-CM

## 2015-05-15 DIAGNOSIS — M79672 Pain in left foot: Secondary | ICD-10-CM | POA: Diagnosis not present

## 2015-05-15 DIAGNOSIS — G4733 Obstructive sleep apnea (adult) (pediatric): Secondary | ICD-10-CM | POA: Diagnosis not present

## 2015-05-15 DIAGNOSIS — M25561 Pain in right knee: Secondary | ICD-10-CM

## 2015-05-15 DIAGNOSIS — Z9989 Dependence on other enabling machines and devices: Principal | ICD-10-CM

## 2015-05-15 NOTE — Progress Notes (Signed)
Subjective:    Patient ID: Crystal Buchanan is a 54 y.o. female.  HPI     Interim history:  Crystal Buchanan is a very pleasant 54 year old right-handed woman with an underlying medical history of chronic low back pain and failed back surgery syndrome, on chronic narcotic pain medication, who presents for followup consultation of her obstructive sleep apnea, well established on treatment with CPAP. The patient is unaccompanied today. I last saw her on 05/11/2014, at which time she reported feeling stable. She was feeling better after CPAP therapy. She had been started on Xanax from her primary care physician for anxiety. She was reporting right knee pain after a fall, and had a follow-up appointment with her doctor for that.  Today, 05/15/2015: I reviewed her CPAP compliance data from 04/13/2015 through 05/12/2015 which is a total of 30 days during which time she used her machine 26 days with percent used days greater than 4 hours at 87%, indicating very good compliance with an average usage of 4 hours and 14 minutes, residual AHI low at 0.3 per hour, leaked low with the 95th percentile at 0.9 L/m, on a pressure of 6 cm with EPR of 2.  Today, 05/15/2015: She reports ongoing issues with right knee pain. She takes narcotic pain medication for knee pain and low back pain. She has bilateral foot pain and sees a foot doctor. She was given a topical gel. She also uses a TENS unit. She recently had nerve biopsies to both distal legs. As far as her CPAP, she is compliant with treatment. She does have some issues with fitting of the nasal pillows. She has been using the medium nasal pillows but also has the large available.  Previously:   I saw her on 11/08/2013, at which time I reviewed her compliance data and her sleep study results. She reported sleeping better, with more consolidated sleep, no residual snoring, and improved restlessness. She had an epidural injection under Dr. Brien Few.   She endorses restless leg  symptoms and has a history of iron deficiency. I checked iron studies and we called her back with the results. She reported needing surgery for her left knee and surgery for fibroids. Her ferritin was 12 and she was advised to increase her iron pills to one pill 3 times a day from twice daily. In the interim, on 12/16/2013 she had resection of a cervical mass.  I reviewed her compliance data from 04/03/2014 through 05/02/2014, which is a total of 30 days during which time she is CPAP every night except for 2 nights, percent used days greater than 4 hours was 90%, average usage of 5 hours, AHI residual at 0.3 per hour, at goal, and leak was low at 1.1 L per minute for the 95th percentile, pressure at 6 cm with EPR of 2.   I first met her on 06/23/2013 at the request of her pain management doctor, Dr. Brien Few. She had a home sleep test in his office showing an AHI of 17.2 per hour. I suggested that she return for sleep study. She had a baseline sleep study on 07/10/2013 and I went over her test results with her in detail today. I also explained to her her second sleep study which was a CPAP titration study on 08/23/2013. Her baseline sleep study from 07/10/2013 showed a sleep efficiency of 41.3% only. She had a markedly prolonged sleep latency at 116 minutes and a high amount of wake after sleep onset at 158 minutes with moderate sleep fragmentation  noted. She had increased percentage of light stage sleep, decreased percentage of slow-wave sleep and a decreased percentage of REM sleep was highly prolonged REM latency. She had mild intermittent snoring. She only slept on her sides. She had a total AHI of 10.6 per hour, rising to 13.8 per hour and REM sleep. Her baseline oxygen saturation was 93% on her nadir was 87%. Her CPAP titration study from 08/23/2013 showed a sleep efficiency of 32.5% only. Sleep latency was 62 minutes and wake after sleep onset was high at 194.5 minutes with mild sleep fragmentation noted  and she was essentially unable to achieve sleep after 3 AM. Her arousal index was normal. She had an increased percentage of light stage sleep, absence of deep sleep, and a decreased percentage of REM sleep with prolonged REM latency. Her baseline oxygen saturation was 94%, her nadir was 89%. CPAP was initiated at 5 cm and titrated to 6 cm on which were AHI was 0.7 per hour. She did not have any significant periodic leg movements of sleep in either study. Based on the test results I suggested she start CPAP treatment at 6 cm of water pressure.   I reviewed her compliance data from 10/04/2013 through 11/02/2013 which is a total of 30 days during which time she used CPAP every night except for 2 nights. Percent used days greater than 4 hours was 93%, indicating excellent compliance. Her pressure is 6 cm with a PR of 2, her average usage was 5 hours and 35 minutes, her residual AHI is low at 0.5 per hour, and her leak was very low.   Her Past Medical History Is Significant For: Past Medical History  Diagnosis Date  . Chicken pox   . Migraine   . Thyroid disease   . Hyperlipidemia   . Sleep apnea   . GERD (gastroesophageal reflux disease)     Her Past Surgical History Is Significant For: Past Surgical History  Procedure Laterality Date  . Lumbar disc surgery  02-26-12  . Knee arthroscopy  1998  . Breast surgery    . Cervical conization w/bx N/A 12/16/2013    Procedure: resection of cervical mass;  Surgeon: Marylynn Pearson, MD;  Location: [facility];  Service: Gynecology;  Laterality: N/A;  resection of cx mass-MD wants to use cold knife cone instrruments for this case    Her Family History Is Significant For: Family History  Problem Relation Age of Onset  . Heart disease Mother   . Arthritis Father   . Hyperlipidemia Father   . Heart disease Father   . Stroke Father   . Hypertension Father   . Diabetes Father   . Diabetes Mother   . Hypertension Mother     Her Social History Is Significant  For: History   Social History  . Marital Status: Married    Spouse Name: N/A  . Number of Children: 3  . Years of Education: 12th   Occupational History  . N/A    Social History Main Topics  . Smoking status: Never Smoker   . Smokeless tobacco: Not on file  . Alcohol Use: No  . Drug Use: No  . Sexual Activity: Not on file   Other Topics Concern  . None   Social History Narrative    Her Allergies Are:  Allergies  Allergen Reactions  . Atorvastatin Nausea Only    Along with stomachaches  . Lyrica [Pregabalin]     Dizziness,blurred vision,increased appetite,h/a  . Codeine Palpitations  .  Gabapentin Rash    Itching and nausa  :   Her Current Medications Are:  Outpatient Encounter Prescriptions as of 05/15/2015  Medication Sig  . (No Medication Selected) Apply 1 application topically.  . ALPRAZolam (XANAX) 0.5 MG tablet 1/2-1 tab BID prn for anxiety  . DULoxetine (CYMBALTA) 30 MG capsule Take 30 mg by mouth daily.  . ferrous sulfate 325 (65 FE) MG tablet Take 325 mg by mouth 2 (two) times daily.  . methocarbamol (ROBAXIN) 500 MG tablet Take 500 mg by mouth daily.  . naproxen (NAPROSYN) 500 MG tablet Take 500 mg by mouth 2 (two) times daily with a meal.  . oxyCODONE-acetaminophen (PERCOCET/ROXICET) 5-325 MG per tablet Take 1 tablet by mouth every 6 (six) hours as needed for pain.  . pantoprazole (PROTONIX) 40 MG tablet Take 1 tablet (40 mg total) by mouth daily.  . rosuvastatin (CRESTOR) 20 MG tablet Take 1 tablet (20 mg total) by mouth daily.  Marland Kitchen tiZANidine (ZANAFLEX) 2 MG tablet Take 2 mg by mouth every 6 (six) hours as needed.   No facility-administered encounter medications on file as of 05/15/2015.  :  Review of Systems:  Out of a complete 14 point review of systems, all are reviewed and negative with the exception of these symptoms as listed below:   Review of Systems  Neurological:       Patient states that she feels like she cannot breathe with CPAP nasal  mask     Objective:  Neurologic Exam  Physical Exam Physical Examination:   Filed Vitals:   05/15/15 1051  BP: 144/92  Pulse: 78  Resp: 16   General Examination: The patient is a very pleasant 54 y.o. female in no acute distress. She appears well-developed and well-nourished and well groomed. She is overweight and has gained weight from last year.   HEENT: Normocephalic, atraumatic, pupils are equal, round and reactive to light and accommodation. Funduscopic exam is normal with sharp disc margins noted. Extraocular tracking is good without limitation to gaze excursion or nystagmus noted. Normal smooth pursuit is noted. Hearing is grossly intact. Face is symmetric with normal facial animation and normal facial sensation. Speech is clear with no dysarthria noted. There is no hypophonia. There is no lip, neck/head, jaw or voice tremor. Neck is supple with full range of passive and active motion. There are no carotid bruits on auscultation. Oropharynx exam reveals: mild mouth dryness, good dental hygiene and moderate airway crowding, due to redundant soft palate and tonsillar size of 1-2+. Mallampati is class III. Tongue protrudes centrally and palate elevates symmetrically. Neck size is mildly enlarged.    Chest: Clear to auscultation without wheezing, rhonchi or crackles noted.  Heart: S1+S2+0, regular and normal without murmurs, rubs or gallops noted.   Abdomen: Soft, non-tender and non-distended with normal bowel sounds appreciated on auscultation.  Extremities: There is no pitting edema in the distal lower extremities bilaterally. Pedal pulses are intact. Mild, non-pitting puffiness is noted in the R ankle > L.   Skin: Warm and dry without trophic changes noted. There are no varicose veins. Two small scars in the distal outer legs from recent Bx.  Musculoskeletal: exam reveals no obvious joint deformities, tenderness or joint swelling or erythema, except for lower and midback pain.    Neurologically:  Mental status: The patient is awake, alert and oriented in all 4 spheres. Her memory, attention, language and knowledge are appropriate. There is no aphasia, agnosia, apraxia or anomia. Speech is clear with normal  prosody and enunciation. Thought process is linear. Mood is congruent and affect is normal.  Cranial nerves are as described above under HEENT exam. In addition, shoulder shrug is normal with equal shoulder height noted. Motor exam: Normal bulk, strength and tone is noted. There is no drift, tremor or rebound. Romberg is negative. Reflexes are 2+ throughout. Toes are downgoing bilaterally. Fine motor skills are intact with normal finger taps, normal hand movements, normal rapid alternating patting, normal foot taps and normal foot agility.  Cerebellar testing shows no dysmetria or intention tremor on finger to nose testing. Heel to shin is unremarkable bilaterally. There is no truncal or gait ataxia.  Sensory exam is intact to light touch, pinprick, vibration, temperature sense in the upper and lower extremities with the exception of decrease in PP, vibration and temperature sense in the R leg distal to the knee, unchanged.  Gait, station and balance: she stands up with mild difficulty and has to push herself up. She walks with a limp on the R. She brought her cane. No veering to one side is noted. No leaning to one side is noted. Posture is age-appropriate and stance is wide based. No problems turning are noted. Tandem walk is difficult for her.              Assessment and Plan:   In summary, Crystal Buchanan is a very pleasant 54 year old female with an underlying medical history of chronic low back pain and failed back surgery syndrome, on chronic narcotic pain medication, R knee pain, b/l foot pain (followed by [facility]), who presents for follow up consultation of her OSA, on CPAP at a pressure of 6 cwp. Her physical exam is stable, with the exception of interim  weight gain. She is compliant with CPAP treatment and is commended for her treatment adherence. She has some problems with fitting the nasal pillows correctly. She is encouraged to make an appointment with her DME provider and there respiratory therapist to get a mask refit. She did not bring her machine and mask today.  We again talked about trying to maintaining a healthy lifestyle in general. I encouraged the patient to eat healthy, exercise daily and keep well hydrated, to keep a scheduled bedtime and wake time routine, to not skip any meals and eat healthy snacks in between meals and to have protein with every meal. I stressed the importance of regular exercise within her limitations. She is advised that with appropriate amount of weight loss she may be able to come off of her CPAP machine in the future. She is encouraged to try to achieve a 20-25 pound weight loss. I would like to see her back in 12 months, sooner if needed. I answered all her questions today and she was in agreement. I spent 15 minutes in total face-to-face time with the patient, more than 50% of which was spent in counseling and coordination of care, reviewing test results, reviewing medication and discussing or reviewing the diagnosis of OSA, its prognosis and treatment options.

## 2015-05-15 NOTE — Patient Instructions (Signed)

## 2016-04-18 ENCOUNTER — Other Ambulatory Visit: Payer: Self-pay | Admitting: Family Medicine

## 2016-04-18 DIAGNOSIS — Z1231 Encounter for screening mammogram for malignant neoplasm of breast: Secondary | ICD-10-CM

## 2016-04-30 ENCOUNTER — Ambulatory Visit
Admission: RE | Admit: 2016-04-30 | Discharge: 2016-04-30 | Disposition: A | Discharge status: Home / Self Care | Payer: Medicare Other | Source: Ambulatory Visit | Attending: Family Medicine | Admitting: Family Medicine

## 2016-04-30 DIAGNOSIS — Z1231 Encounter for screening mammogram for malignant neoplasm of breast: Secondary | ICD-10-CM

## 2016-05-12 DIAGNOSIS — M5417 Radiculopathy, lumbosacral region: Secondary | ICD-10-CM | POA: Diagnosis not present

## 2016-05-13 ENCOUNTER — Telehealth: Payer: Self-pay

## 2016-05-13 NOTE — Telephone Encounter (Signed)
I spoke to patient and she will bring CPAP to appt tomorrow.

## 2016-05-14 ENCOUNTER — Ambulatory Visit (INDEPENDENT_AMBULATORY_CARE_PROVIDER_SITE_OTHER): Payer: Medicare Other | Admitting: Neurology

## 2016-05-14 ENCOUNTER — Encounter: Payer: Self-pay | Admitting: Neurology

## 2016-05-14 VITALS — BP 140/88 | HR 78 | Resp 16 | Ht 68.0 in | Wt 215.0 lb

## 2016-05-14 DIAGNOSIS — Z9989 Dependence on other enabling machines and devices: Principal | ICD-10-CM

## 2016-05-14 DIAGNOSIS — G4733 Obstructive sleep apnea (adult) (pediatric): Secondary | ICD-10-CM | POA: Diagnosis not present

## 2016-05-14 NOTE — Patient Instructions (Signed)
I have placed an order for your CPAP supplies; we will fax to [facility], they should be in touch and mail your supplies.

## 2016-05-14 NOTE — Progress Notes (Signed)
Subjective:    Patient ID: Crystal Buchanan is a 55 y.o. female.  HPI      Interim history:   Crystal Buchanan is a very pleasant 55 year old right-handed woman with an underlying medical history of chronic low back pain and failed back surgery syndrome, on chronic narcotic pain medication, who presents for followup consultation of her obstructive sleep apnea, on treatment with CPAP. The patient is unaccompanied today. I last saw her on 05/15/2015, at which time she was compliant with CPAP. She had ongoing issues with right knee pain. She was on narcotic pain medication for knee pain and back pain. She was seeing a doctor as well. I suggested from my end of things a one-year checkup for sleep apnea on CPAP compliance.  Today, 05/14/2016: She reports that she had not used her CPAP in several weeks. Her 90 day compliance data from 02/14/2016 through 05/13/2016 indicates that she stopped using CPAP on 03/16/2016. She states she needed new supplies and her DME company told her she needed a face-to-face office visit.  Previously:    I saw her on 05/11/2014, at which time she reported feeling stable. She was feeling better after CPAP therapy. She had been started on Xanax from her primary care physician for anxiety. She was reporting right knee pain after a fall, and had a follow-up appointment with her doctor for that.   I reviewed her CPAP compliance data from 04/13/2015 through 05/12/2015 which is a total of 30 days during which time she used her machine 26 days with percent used days greater than 4 hours at 87%, indicating very good compliance with an average usage of 4 hours and 14 minutes, residual AHI low at 0.3 per hour, leaked low with the 95th percentile at 0.9 L/m, on a pressure of 6 cm with EPR of 2.   I saw her on 11/08/2013, at which time I reviewed her compliance data and her sleep study results. She reported sleeping better, with more consolidated sleep, no residual snoring, and improved  restlessness. She had an epidural injection under Dr. Brien Few.   She endorses restless leg symptoms and has a history of iron deficiency. I checked iron studies and we called her back with the results. She reported needing surgery for her left knee and surgery for fibroids. Her ferritin was 12 and she was advised to increase her iron pills to one pill 3 times a day from twice daily. In the interim, on 12/16/2013 she had resection of a cervical mass.   I reviewed her compliance data from 04/03/2014 through 05/02/2014, which is a total of 30 days during which time she is CPAP every night except for 2 nights, percent used days greater than 4 hours was 90%, average usage of 5 hours, AHI residual at 0.3 per hour, at goal, and leak was low at 1.1 L per minute for the 95th percentile, pressure at 6 cm with EPR of 2.     I first met her on 06/23/2013 at the request of her pain management doctor, Dr. Brien Few. She had a home sleep test in his office showing an AHI of 17.2 per hour. I suggested that she return for sleep study. She had a baseline sleep study on 07/10/2013 and I went over her test results with her in detail today. I also explained to her her second sleep study which was a CPAP titration study on 08/23/2013. Her baseline sleep study from 07/10/2013 showed a sleep efficiency of 41.3% only. She had a markedly  prolonged sleep latency at 116 minutes and a high amount of wake after sleep onset at 158 minutes with moderate sleep fragmentation noted. She had increased percentage of light stage sleep, decreased percentage of slow-wave sleep and a decreased percentage of REM sleep was highly prolonged REM latency. She had mild intermittent snoring. She only slept on her sides. She had a total AHI of 10.6 per hour, rising to 13.8 per hour and REM sleep. Her baseline oxygen saturation was 93% on her nadir was 87%. Her CPAP titration study from 08/23/2013 showed a sleep efficiency of 32.5% only. Sleep latency was 62  minutes and wake after sleep onset was high at 194.5 minutes with mild sleep fragmentation noted and she was essentially unable to achieve sleep after 3 AM. Her arousal index was normal. She had an increased percentage of light stage sleep, absence of deep sleep, and a decreased percentage of REM sleep with prolonged REM latency. Her baseline oxygen saturation was 94%, her nadir was 89%. CPAP was initiated at 5 cm and titrated to 6 cm on which were AHI was 0.7 per hour. She did not have any significant periodic leg movements of sleep in either study. Based on the test results I suggested she start CPAP treatment at 6 cm of water pressure.   I reviewed her compliance data from 10/04/2013 through 11/02/2013 which is a total of 30 days during which time she used CPAP every night except for 2 nights. Percent used days greater than 4 hours was 93%, indicating excellent compliance. Her pressure is 6 cm with a PR of 2, her average usage was 5 hours and 35 minutes, her residual AHI is low at 0.5 per hour, and her leak was very low.     Her Past Medical History Is Significant For: Past Medical History:  Diagnosis Date  . Chicken pox   . GERD (gastroesophageal reflux disease)   . Hyperlipidemia   . Migraine   . Sleep apnea   . Thyroid disease     Her Past Surgical History Is Significant For: Past Surgical History:  Procedure Laterality Date  . BREAST SURGERY    . CERVICAL CONIZATION W/BX N/A 12/16/2013   Procedure: resection of cervical mass;  Surgeon: Marylynn Pearson, MD;  Location: [facility];  Service: Gynecology;  Laterality: N/A;  resection of cx mass-MD wants to use cold knife cone instrruments for this case  . KNEE ARTHROSCOPY  1998  . LUMBAR DISC SURGERY  02-26-12    Her Family History Is Significant For: Family History  Problem Relation Age of Onset  . Heart disease Mother   . Arthritis Father   . Hyperlipidemia Father   . Heart disease Father   . Stroke Father   . Hypertension Father   .  Diabetes Father   . Diabetes Mother   . Hypertension Mother     Her Social History Is Significant For: Social History   Social History  . Marital status: Married    Spouse name: N/A  . Number of children: 3  . Years of education: 12th   Occupational History  . N/A    Social History Main Topics  . Smoking status: Never Smoker  . Smokeless tobacco: None  . Alcohol use No  . Drug use: No  . Sexual activity: Not Asked   Other Topics Concern  . None   Social History Narrative  . None    Her Allergies Are:  Allergies  Allergen Reactions  . Atorvastatin Nausea Only  Along with stomachaches  . Lyrica [Pregabalin]     Dizziness,blurred vision,increased appetite,h/a  . Codeine Palpitations  . Gabapentin Rash    Itching and nausa  :   Her Current Medications Are:  Outpatient Encounter Prescriptions as of 05/14/2016  Medication Sig  . (No Medication Selected) Apply 1 application topically.  . ALPRAZolam (XANAX) 0.5 MG tablet 1/2-1 tab BID prn for anxiety  . DULoxetine (CYMBALTA) 30 MG capsule Take 30 mg by mouth daily.  . ferrous sulfate 325 (65 FE) MG tablet Take 325 mg by mouth 2 (two) times daily.  . methocarbamol (ROBAXIN) 500 MG tablet Take 500 mg by mouth daily.  . naproxen (NAPROSYN) 500 MG tablet Take 500 mg by mouth 2 (two) times daily with a meal.  . oxyCODONE-acetaminophen (PERCOCET/ROXICET) 5-325 MG per tablet Take 1 tablet by mouth every 6 (six) hours as needed for pain.  . pantoprazole (PROTONIX) 40 MG tablet Take 1 tablet (40 mg total) by mouth daily.  . rosuvastatin (CRESTOR) 20 MG tablet Take 1 tablet (20 mg total) by mouth daily.  Marland Kitchen tiZANidine (ZANAFLEX) 2 MG tablet Take 2 mg by mouth every 6 (six) hours as needed.   No facility-administered encounter medications on file as of 05/14/2016.   :  Review of Systems:  Out of a complete 14 point review of systems, all are reviewed and negative with the exception of these symptoms as listed below:  Review  of Systems  Neurological:       Patient states that she needs a new prescription for CPAP supplies send to [place]. She has not used CPAP in last several weeks due to needing new supplies.     Objective:  Neurologic Exam  Physical Exam Physical Examination:   Vitals:   05/14/16 1119  BP: 140/88  Pulse: 78  Resp: 16   General Examination: The patient is a very pleasant 55 yo female in no acute distress. She is in good spirits today.  HEENT: Normocephalic, atraumatic, pupils are equal, round and reactive to light and accommodation. Extraocular tracking is good without limitation to gaze excursion or nystagmus noted. Normal smooth pursuit is noted. Hearing is grossly intact. Face is symmetric with normal facial animation and normal facial sensation. Speech is clear with no dysarthria noted. There is no hypophonia. There is no lip, neck/head, jaw or voice tremor. Neck is supple with full range of passive and active motion. There are no carotid bruits on auscultation. Oropharynx exam reveals: mild mouth dryness, good dental hygiene and moderate airway crowding, due to redundant soft palate and tonsillar size of 1-2+. Mallampati is class III. Tongue protrudes centrally and palate elevates symmetrically. Neck size is mildly enlarged.    Chest: Clear to auscultation without wheezing, rhonchi or crackles noted.  Heart: S1+S2+0, regular and normal without murmurs, rubs or gallops noted.   Abdomen: Soft, non-tender and non-distended with normal bowel sounds appreciated on auscultation.  Extremities: There is no pitting edema in the distal lower extremities bilaterally. Pedal pulses are intact. Mild, non-pitting puffiness is noted in the R ankle > L.   Skin: Warm and dry without trophic changes noted. There are no varicose veins.   Musculoskeletal: exam reveals no obvious joint deformities, tenderness or joint swelling or erythema, except for lower and midback pain, L knee pain, Unremarkable scars  from knee arthroscopic surgery.  Neurologically:  Mental status: The patient is awake, alert and oriented in all 4 spheres. Her memory, attention, language and knowledge are appropriate. There is no  aphasia, agnosia, apraxia or anomia. Speech is clear with normal prosody and enunciation. Thought process is linear. Mood is congruent and affect is normal.  Cranial nerves are as described above under HEENT exam. In addition, shoulder shrug is normal with equal shoulder height noted. Motor exam: Normal bulk, strength and tone is noted. There is no drift, tremor or rebound. Romberg is negative. Reflexes are 2+ throughout. Toes are downgoing bilaterally. Fine motor skills are intact with normal finger taps, normal hand movements, normal rapid alternating patting, normal foot taps and normal foot agility.  Cerebellar testing shows no dysmetria or intention tremor on finger to nose testing. Heel to shin is unremarkable bilaterally. There is no truncal or gait ataxia.  Sensory exam: decrease in PP, vibration and temperature sense in the R leg distal to the knee, unchanged.  Gait, station and balance: she stands up with mild difficulty and has to push herself up. She walks with a limp on the R. She brought her cane. No veering to one side is noted. No leaning to one side is noted. Posture is age-appropriate and stance is wide based. No problems turning are noted. Tandem walk is difficult for her, stable.              Assessment and Plan:   In summary, Crystal Buchanan is a very pleasant 55 year old female with an underlying medical history of chronic low back pain and failed back surgery syndrome, on chronic narcotic pain medication, b/l knee pain, b/l foot pain, s/p L knee surgery, who presents for follow up consultation of her OSA, on CPAP at a pressure of 6 cwp, but has not used it in the last 2 months, needed supplies. She was advised to call us in the future if this ever occurred again. I renewed her supply  orderAnd we will fax to her DME company. They usually mail her her supplies. Her physical exam is stable. Weight is fluctuating. She has been compliant with CPAP therapy in the past. She is commended for this. She is advised to continue with it regularly and I will see her back in about a year, sooner as needed. She is encouraged to try to lose weight. I answered all her questions today and she was in agreement. I spent 25 minutes in total face-to-face time with the patient, more than 50% of which was spent in counseling and coordination of care, reviewing test results, reviewing medication and discussing or reviewing the diagnosis of OSA, its prognosis and treatment options.

## 2016-07-18 ENCOUNTER — Encounter: Payer: Medicare Other | Admitting: Family Medicine

## 2016-08-04 ENCOUNTER — Ambulatory Visit (INDEPENDENT_AMBULATORY_CARE_PROVIDER_SITE_OTHER): Payer: Medicare Other | Admitting: Family Medicine

## 2016-08-04 ENCOUNTER — Encounter: Payer: Self-pay | Admitting: Family Medicine

## 2016-08-04 VITALS — BP 123/82 | HR 72 | Temp 98.2°F | Resp 16 | Ht 68.0 in | Wt 215.2 lb

## 2016-08-04 DIAGNOSIS — D509 Iron deficiency anemia, unspecified: Secondary | ICD-10-CM

## 2016-08-04 DIAGNOSIS — Z124 Encounter for screening for malignant neoplasm of cervix: Secondary | ICD-10-CM

## 2016-08-04 DIAGNOSIS — Z1211 Encounter for screening for malignant neoplasm of colon: Secondary | ICD-10-CM

## 2016-08-04 DIAGNOSIS — E010 Iodine-deficiency related diffuse (endemic) goiter: Secondary | ICD-10-CM | POA: Insufficient documentation

## 2016-08-04 DIAGNOSIS — E785 Hyperlipidemia, unspecified: Secondary | ICD-10-CM

## 2016-08-04 DIAGNOSIS — Z0000 Encounter for general adult medical examination without abnormal findings: Secondary | ICD-10-CM

## 2016-08-04 DIAGNOSIS — Z9989 Dependence on other enabling machines and devices: Secondary | ICD-10-CM

## 2016-08-04 DIAGNOSIS — G4733 Obstructive sleep apnea (adult) (pediatric): Secondary | ICD-10-CM | POA: Insufficient documentation

## 2016-08-04 DIAGNOSIS — F418 Other specified anxiety disorders: Secondary | ICD-10-CM

## 2016-08-04 DIAGNOSIS — K219 Gastro-esophageal reflux disease without esophagitis: Secondary | ICD-10-CM

## 2016-08-04 LAB — CBC WITH DIFFERENTIAL/PLATELET
BASOS ABS: 0 10*3/uL (ref 0.0–0.1)
Basophils Relative: 0.6 % (ref 0.0–3.0)
Eosinophils Absolute: 0.3 10*3/uL (ref 0.0–0.7)
Eosinophils Relative: 3.3 % (ref 0.0–5.0)
HCT: 42 % (ref 36.0–46.0)
Hemoglobin: 14 g/dL (ref 12.0–15.0)
Lymphocytes Relative: 50.6 % — ABNORMAL HIGH (ref 12.0–46.0)
Lymphs Abs: 3.9 10*3/uL (ref 0.7–4.0)
MCHC: 33.3 g/dL (ref 30.0–36.0)
MCV: 88.7 fl (ref 78.0–100.0)
MONO ABS: 0.5 10*3/uL (ref 0.1–1.0)
MONOS PCT: 6.1 % (ref 3.0–12.0)
NEUTROS PCT: 39.4 % — AB (ref 43.0–77.0)
Neutro Abs: 3 10*3/uL (ref 1.4–7.7)
Platelets: 289 10*3/uL (ref 150.0–400.0)
RBC: 4.74 Mil/uL (ref 3.87–5.11)
RDW: 14.6 % (ref 11.5–15.5)
WBC: 7.7 10*3/uL (ref 4.0–10.5)

## 2016-08-04 LAB — TSH: TSH: 3.28 u[IU]/mL (ref 0.35–4.50)

## 2016-08-04 LAB — LIPID PANEL
Cholesterol: 306 mg/dL — ABNORMAL HIGH (ref 0–200)
HDL: 49.6 mg/dL (ref >39.00)
NonHDL: 255.93
Total CHOL/HDL Ratio: 6
Triglycerides: 361 mg/dL — ABNORMAL HIGH (ref 0.0–149.0)
VLDL: 72.2 mg/dL — ABNORMAL HIGH (ref 0.0–40.0)

## 2016-08-04 LAB — HEPATIC FUNCTION PANEL
ALK PHOS: 60 U/L (ref 39–117)
ALT: 17 U/L (ref 0–35)
AST: 19 U/L (ref 0–37)
Albumin: 4.6 g/dL (ref 3.5–5.2)
Bilirubin, Direct: 0.1 mg/dL (ref 0.0–0.3)
TOTAL PROTEIN: 8.1 g/dL (ref 6.0–8.3)
Total Bilirubin: 0.6 mg/dL (ref 0.2–1.2)

## 2016-08-04 LAB — BASIC METABOLIC PANEL
BUN: 12 mg/dL (ref 6–23)
CO2: 30 mEq/L (ref 19–32)
Calcium: 9.9 mg/dL (ref 8.4–10.5)
Chloride: 103 mEq/L (ref 96–112)
Creatinine, Ser: 0.92 mg/dL (ref 0.40–1.20)
GFR: 81.91 mL/min (ref >60.00)
Glucose, Bld: 113 mg/dL — ABNORMAL HIGH (ref 70–99)
POTASSIUM: 4.4 meq/L (ref 3.5–5.1)
Sodium: 140 mEq/L (ref 135–145)

## 2016-08-04 LAB — IBC PANEL
IRON: 77 ug/dL (ref 42–145)
SATURATION RATIOS: 26.6 % (ref 20.0–50.0)
TRANSFERRIN: 207 mg/dL — AB (ref 212.0–360.0)

## 2016-08-04 LAB — LDL CHOLESTEROL, DIRECT: LDL DIRECT: 213 mg/dL

## 2016-08-04 MED ORDER — PANTOPRAZOLE SODIUM 40 MG PO TBEC: 40.0000 mg | DELAYED_RELEASE_TABLET | Freq: Every day | ORAL | 6 refills | Status: DC

## 2016-08-04 NOTE — Progress Notes (Signed)
Pre visit review using our clinic review tool, if applicable. No additional management support is needed unless otherwise documented below in the visit note. 

## 2016-08-04 NOTE — Progress Notes (Signed)
   Subjective:    Patient ID: Crystal Buchanan, female    DOB: [birth year], 55 y.o.   MRN: [id number]  HPI Here today for CPE.  Risk Factors: Hyperlipidemia- chronic problem, stopped Crestor.  Not getting regular exercise, not following particular diet.  Denies CP, SOB, HAs, visual changes, abd pain, N/V OSA- wearing CPAP nightly.  Pt following w/ Dr Antony Haste (Neurology).  Denies excessive daytime sedation, improved energy during the day.  Sleeping well at night. Physical Activity: limited due to chronic pain Fall Risk: elevated, walks w/ cane Depression: pt has days that she doesn't want to get out of bed.  Low energy level, low motivation. Hearing: normal to conversational tones, whispered voice at 6 ft ADL's: independent Cognitive: normal linear thought process, memory and attention intact Home Safety: safe at thome Height, Weight, BMI, Visual Acuity: see vitals, vision corrected to 20/20 w/ glasses Counseling: due for colonoscopy, mammo, pap Julien Girt). Care team reviewed and updated Health Care POA and Living Will: discussed w/ pt, she will discuss w/ husband Labs Ordered: See A&P Care Plan: See A&P    Review of Systems Patient reports no vision/ hearing changes, adenopathy,fever, weight change,  persistant/recurrent hoarseness , swallowing issues, chest pain, palpitations, edema, persistant/recurrent cough, hemoptysis, dyspnea (rest/exertional/paroxysmal nocturnal), gastrointestinal bleeding (melena, rectal bleeding), abdominal pain, bowel changes, GU symptoms (dysuria, hematuria, incontinence), Gyn symptoms (abnormal  bleeding, pain),  syncope, memory loss, skin/hair/nail changes, abnormal bruising or bleeding.   + GERD- pt ran out of Protonix (this was effective) + numbness/tingling of hands/feet- due to chronic back pain/surgery    Objective:   Physical Exam General Appearance:    Alert, cooperative, no distress, appears stated age, obese  Head:    Normocephalic, without obvious  abnormality, atraumatic  Eyes:    PERRL, conjunctiva/corneas clear, EOM's intact, fundi    benign, both eyes  Ears:    Normal TM's and external ear canals, both ears  Nose:   Nares normal, septum midline, mucosa normal, no drainage    or sinus tenderness  Throat:   Lips, mucosa, and tongue normal; teeth and gums normal  Neck:   Supple, symmetrical, trachea midline, no adenopathy;    Thyroid: diffuse enlargement, R>L  Back:     Symmetric, no curvature, ROM normal, no CVA tenderness  Lungs:     Clear to auscultation bilaterally, respirations unlabored  Chest Wall:    No tenderness or deformity   Heart:    Regular rate and rhythm, S1 and S2 normal, no murmur, rub   or gallop  Breast Exam:    Deferred to GYN  Abdomen:     Soft, non-tender, bowel sounds active all four quadrants,    no masses, no organomegaly  Genitalia:    Deferred to GYN  Rectal:    Extremities:   Extremities normal, atraumatic, no cyanosis or edema  Pulses:   2+ and symmetric all extremities  Skin:   Skin color, texture, turgor normal, no rashes or lesions  Lymph nodes:   Cervical, supraclavicular, and axillary nodes normal  Neurologic:   CNII-XII intact          Assessment & Plan:

## 2016-08-04 NOTE — Patient Instructions (Signed)
Follow up in 6 months to recheck cholesterol We'll notify you of your lab results and make any changes if needed We'll call you with your GYN appt and your GI appt for the colonoscopy consultation Please ask Dr Brien Few if he is in agreement w/ increasing the Cymbalta to improve your anxiety and depression Continue to get regular activity and make healthy food choices- you can do it! Restart the Protonix once daily for the reflux Call with any questions or concerns Hang in there!!  You're doing great!!

## 2016-08-05 ENCOUNTER — Other Ambulatory Visit (INDEPENDENT_AMBULATORY_CARE_PROVIDER_SITE_OTHER): Payer: Medicare Other

## 2016-08-05 ENCOUNTER — Encounter: Payer: Self-pay | Admitting: Internal Medicine

## 2016-08-05 DIAGNOSIS — R7309 Other abnormal glucose: Secondary | ICD-10-CM

## 2016-08-05 LAB — HEMOGLOBIN A1C: Hgb A1c MFr Bld: 6.6 % — ABNORMAL HIGH (ref 4.6–6.5)

## 2016-08-05 NOTE — Assessment & Plan Note (Signed)
Pt has hx of similar.  + fatigue but this may be related to chronic pain and depression.  Check labs.  Replete iron prn.

## 2016-08-05 NOTE — Assessment & Plan Note (Signed)
New.  Check labs and get US to assess. 

## 2016-08-05 NOTE — Assessment & Plan Note (Signed)
New to provider, ongoing for pt.  She reports compliance w/ her CPAP.  Following w/ Neuro for this.

## 2016-08-05 NOTE — Assessment & Plan Note (Signed)
Recurrent issue for pt.  She stopped her PPI when she did not return for refills.  Will restart today.  Reviewed lifestyle and dietary modifications that will also help.  Will follow.

## 2016-08-05 NOTE — Assessment & Plan Note (Signed)
New.  Pt admits to anger, frustration, low motivation, low energy.  Encouraged her to discuss increasing the Cymbalta w/ Dr Brien Few.  Will follow.

## 2016-08-05 NOTE — Assessment & Plan Note (Signed)
Pt's PE WNL w/ exception of obesity.  She is overdue for pap and mammo- will refer back to Dr Julien Girt (she has seen previously).  Due for colonoscopy- will refer to GI.  Written screening schedule updated and given to pt.  Check labs.  Anticipatory guidance provided.

## 2016-08-05 NOTE — Assessment & Plan Note (Signed)
Pt stopped her statin when she did not return for f/u.  Pt has hx of very high lipids.  Stressed need for healthy diet, regular exercise, and the need to take meds as prescribed.  Will follow.

## 2016-08-06 ENCOUNTER — Other Ambulatory Visit: Payer: Self-pay | Admitting: General Practice

## 2016-08-06 DIAGNOSIS — E785 Hyperlipidemia, unspecified: Secondary | ICD-10-CM

## 2016-08-06 MED ORDER — ROSUVASTATIN CALCIUM 20 MG PO TABS: 20.0000 mg | ORAL_TABLET | Freq: Every day | ORAL | 6 refills | Status: DC

## 2016-08-12 DIAGNOSIS — Z6833 Body mass index (BMI) 33.0-33.9, adult: Secondary | ICD-10-CM | POA: Diagnosis not present

## 2016-08-12 DIAGNOSIS — Z1231 Encounter for screening mammogram for malignant neoplasm of breast: Secondary | ICD-10-CM | POA: Diagnosis not present

## 2016-08-12 DIAGNOSIS — Z124 Encounter for screening for malignant neoplasm of cervix: Secondary | ICD-10-CM | POA: Diagnosis not present

## 2016-08-14 ENCOUNTER — Other Ambulatory Visit: Payer: Self-pay | Admitting: Obstetrics and Gynecology

## 2016-08-14 DIAGNOSIS — R928 Other abnormal and inconclusive findings on diagnostic imaging of breast: Secondary | ICD-10-CM

## 2016-08-15 ENCOUNTER — Ambulatory Visit
Admission: RE | Admit: 2016-08-15 | Discharge: 2016-08-15 | Disposition: A | Discharge status: Home / Self Care | Payer: Medicare Other | Source: Ambulatory Visit | Attending: Family Medicine | Admitting: Family Medicine

## 2016-08-15 DIAGNOSIS — E010 Iodine-deficiency related diffuse (endemic) goiter: Secondary | ICD-10-CM

## 2016-08-15 DIAGNOSIS — E049 Nontoxic goiter, unspecified: Secondary | ICD-10-CM | POA: Diagnosis not present

## 2016-08-18 ENCOUNTER — Encounter: Payer: Self-pay | Admitting: General Practice

## 2016-08-18 NOTE — Progress Notes (Signed)
Called pt and lmovm to return call.

## 2016-08-19 ENCOUNTER — Ambulatory Visit
Admission: RE | Admit: 2016-08-19 | Discharge: 2016-08-19 | Disposition: A | Discharge status: Home / Self Care | Payer: Medicare Other | Source: Ambulatory Visit | Attending: Obstetrics and Gynecology | Admitting: Obstetrics and Gynecology

## 2016-08-19 DIAGNOSIS — R928 Other abnormal and inconclusive findings on diagnostic imaging of breast: Secondary | ICD-10-CM | POA: Diagnosis not present

## 2016-09-22 ENCOUNTER — Other Ambulatory Visit (INDEPENDENT_AMBULATORY_CARE_PROVIDER_SITE_OTHER): Payer: Medicare Other

## 2016-09-22 DIAGNOSIS — E785 Hyperlipidemia, unspecified: Secondary | ICD-10-CM | POA: Diagnosis not present

## 2016-09-22 LAB — HEPATIC FUNCTION PANEL
ALBUMIN: 4.4 g/dL (ref 3.5–5.2)
ALT: 18 U/L (ref 0–35)
AST: 18 U/L (ref 0–37)
Alkaline Phosphatase: 62 U/L (ref 39–117)
Bilirubin, Direct: 0.1 mg/dL (ref 0.0–0.3)
Total Bilirubin: 0.7 mg/dL (ref 0.2–1.2)
Total Protein: 7.8 g/dL (ref 6.0–8.3)

## 2016-09-23 ENCOUNTER — Encounter: Payer: Self-pay | Admitting: General Practice

## 2016-10-03 ENCOUNTER — Ambulatory Visit (INDEPENDENT_AMBULATORY_CARE_PROVIDER_SITE_OTHER): Payer: Medicare Other | Admitting: Internal Medicine

## 2016-10-03 ENCOUNTER — Encounter: Payer: Self-pay | Admitting: Internal Medicine

## 2016-10-03 ENCOUNTER — Telehealth: Payer: Self-pay

## 2016-10-03 VITALS — BP 114/94 | HR 78 | Ht 68.0 in | Wt 215.2 lb

## 2016-10-03 DIAGNOSIS — Z1211 Encounter for screening for malignant neoplasm of colon: Secondary | ICD-10-CM | POA: Diagnosis not present

## 2016-10-03 DIAGNOSIS — Z1212 Encounter for screening for malignant neoplasm of rectum: Secondary | ICD-10-CM

## 2016-10-03 DIAGNOSIS — K219 Gastro-esophageal reflux disease without esophagitis: Secondary | ICD-10-CM | POA: Diagnosis not present

## 2016-10-03 NOTE — Telephone Encounter (Signed)
Faxed Cologuard form

## 2016-10-03 NOTE — Patient Instructions (Signed)
If you are age 53 or older, your body mass index should be between 23-30. Your Body mass index is 32.73 kg/m. If this is out of the aforementioned range listed, please consider follow up with your Primary Care Provider.  If you are age 36 or younger, your body mass index should be between 19-25. Your Body mass index is 32.73 kg/m. If this is out of the aformentioned range listed, please consider follow up with your Primary Care Provider.   We have sent your demographic and insurance information to [employer]. They should contact you within the next week regarding your Cologuard (colon cancer screening) test. If you have not heard from them within the next week, please call our office at [phone].  Thank you for choosing [facility]

## 2016-10-03 NOTE — Progress Notes (Signed)
HISTORY OF PRESENT ILLNESS:  Crystal Buchanan is a 54 y.o. female who sent today regarding screen colonoscopy. The patient wishes to discuss colon cancer screening strategies. She also reports a history of GERD for which she was placed on pantoprazole with complete resolution of symptoms. GI review of systems is negative. No family history of colon cancer. Normal hemoglobin of 14.0. She is accompanied by her husband  REVIEW OF SYSTEMS:  All non-GI ROS negative except for back pain, fatigue, headaches, anxiety, sleeping problems, ankle swelling, excessive urination  Past Medical History:  Diagnosis Date  . Chicken pox   . GERD (gastroesophageal reflux disease)   . Hyperlipidemia   . Migraine   . Neuropathy ([place])    right foot  . OSA on CPAP   . Sleep apnea   . Thyroid disease     Past Surgical History:  Procedure Laterality Date  . BREAST SURGERY    . CERVICAL CONIZATION W/BX N/A 12/16/2013   Procedure: resection of cervical mass;  Surgeon: Marylynn Pearson, MD;  Location: [facility];  Service: Gynecology;  Laterality: N/A;  resection of cx mass-MD wants to use cold knife cone instrruments for this case  . KNEE ARTHROSCOPY  1998  . [place] SURGERY  02-26-12    Social History Crystal Buchanan  reports that she has never smoked. She has never used smokeless tobacco. She reports that she does not drink alcohol or use drugs.  family history includes Arthritis in her father; Diabetes in her father and mother; Heart disease in her father and mother; Hyperlipidemia in her father; Hypertension in her father, mother, sister, and sister; Stroke in her father.  Allergies  Allergen Reactions  . Atorvastatin Nausea Only    Along with stomachaches  . Lyrica [Pregabalin]     Dizziness,blurred vision,increased appetite,h/a  . Codeine Palpitations  . Gabapentin Rash    Itching and nausa       PHYSICAL EXAMINATION: Vital signs: BP (!) 114/94   Pulse 78   Ht 5\' 8"  (1.727 m)   Wt 215 lb 4 oz  (97.6 kg)   BMI 32.73 kg/m  Abdomen: Not examined Psychiatric: alert and oriented x3. Cooperative  ASSESSMENT:  #1. Multiple colon cancer screening options discussed. The patient chose Cologuard. She understands a positive test would elicit the recommendation for colonoscopy. She also understands that a negative test would necessitate repeating cologuard in 3 years unless she chose another colon cancer screening strategies at that time. That should be coordinated through her PCP office. She understands. #2. GERD without alarm features. Better on PPI    PLAN:  #1. Cologuard. We will inform her of results when available #2. Reflux precautions with attention to weight loss #3. Okay to continue PPI. Lowest dose to control symptoms #4. Resume general medical care with PCP

## 2016-10-04 LAB — HM PAP SMEAR

## 2016-10-04 LAB — HM MAMMOGRAPHY: HM Mammogram: NORMAL (ref 0–4)

## 2016-10-06 ENCOUNTER — Telehealth: Payer: Self-pay | Admitting: Internal Medicine

## 2016-10-06 NOTE — Telephone Encounter (Signed)
Discussed with pt that the cologuard does go to [facility].

## 2016-10-21 DIAGNOSIS — Z1211 Encounter for screening for malignant neoplasm of colon: Secondary | ICD-10-CM | POA: Diagnosis not present

## 2016-10-21 DIAGNOSIS — Z1212 Encounter for screening for malignant neoplasm of rectum: Secondary | ICD-10-CM | POA: Diagnosis not present

## 2016-10-22 DIAGNOSIS — M25571 Pain in right ankle and joints of right foot: Secondary | ICD-10-CM | POA: Diagnosis not present

## 2016-10-22 DIAGNOSIS — M2011 Hallux valgus (acquired), right foot: Secondary | ICD-10-CM | POA: Diagnosis not present

## 2016-10-22 DIAGNOSIS — M7751 Other enthesopathy of right foot: Secondary | ICD-10-CM | POA: Diagnosis not present

## 2016-10-27 ENCOUNTER — Telehealth: Payer: Self-pay

## 2016-10-27 ENCOUNTER — Other Ambulatory Visit: Payer: Self-pay

## 2016-10-27 LAB — COLOGUARD: Cologuard: POSITIVE

## 2016-10-27 NOTE — Telephone Encounter (Signed)
Received cologuard results are positive. will forward to Dr Henrene Pastor.

## 2016-11-20 LAB — COLOGUARD

## 2016-12-15 ENCOUNTER — Ambulatory Visit (AMBULATORY_SURGERY_CENTER): Payer: Self-pay

## 2016-12-15 VITALS — Ht 67.0 in | Wt 215.2 lb

## 2016-12-15 DIAGNOSIS — R195 Other fecal abnormalities: Secondary | ICD-10-CM

## 2016-12-15 MED ORDER — NA SULFATE-K SULFATE-MG SULF 17.5-3.13-1.6 GM/177ML PO SOLN: 1.0000 | Freq: Once | ORAL | 0 refills | Status: AC

## 2016-12-15 NOTE — Progress Notes (Signed)
Patient denies allergies to eggs or soy. Patient is not on diet pills. Patient is not on home 02. Patient does not have a problem with sedation.  Emmi information given to patient.

## 2016-12-18 HISTORY — PX: OTHER SURGICAL HISTORY: SHX169

## 2016-12-25 ENCOUNTER — Telehealth: Payer: Self-pay | Admitting: Internal Medicine

## 2016-12-25 NOTE — Telephone Encounter (Signed)
instructed pt we can do her colon with no issues while on her cycle- pt states she is very nervous about her procedure- we discussed the colon, the prep, sedation and pt stated she felt more comfortable. Encouraged her to call with further questions or concerns.  Lelan Pons PV

## 2016-12-30 ENCOUNTER — Encounter: Payer: Self-pay | Admitting: Internal Medicine

## 2016-12-30 ENCOUNTER — Ambulatory Visit (AMBULATORY_SURGERY_CENTER): Payer: Medicare Other | Admitting: Internal Medicine

## 2016-12-30 VITALS — BP 128/82 | HR 73 | Temp 97.5°F | Resp 16 | Ht 67.0 in | Wt 215.0 lb

## 2016-12-30 DIAGNOSIS — G4733 Obstructive sleep apnea (adult) (pediatric): Secondary | ICD-10-CM | POA: Diagnosis not present

## 2016-12-30 DIAGNOSIS — K635 Polyp of colon: Secondary | ICD-10-CM

## 2016-12-30 DIAGNOSIS — K219 Gastro-esophageal reflux disease without esophagitis: Secondary | ICD-10-CM | POA: Diagnosis not present

## 2016-12-30 DIAGNOSIS — D124 Benign neoplasm of descending colon: Secondary | ICD-10-CM

## 2016-12-30 DIAGNOSIS — R195 Other fecal abnormalities: Secondary | ICD-10-CM

## 2016-12-30 DIAGNOSIS — R194 Change in bowel habit: Secondary | ICD-10-CM | POA: Diagnosis not present

## 2016-12-30 DIAGNOSIS — D122 Benign neoplasm of ascending colon: Secondary | ICD-10-CM

## 2016-12-30 MED ORDER — SODIUM CHLORIDE 0.9 % IV SOLN: 500.0000 mL | INTRAVENOUS | Status: DC

## 2016-12-30 NOTE — Progress Notes (Signed)
Called to room to assist during endoscopic procedure.  Patient ID and intended procedure confirmed with present staff. Received instructions for my participation in the procedure from the performing physician.  

## 2016-12-30 NOTE — Progress Notes (Signed)
Spontaneous respirations throughout. VSS. Resting comfortably. To PACU on room air. Report to  Sara RN. 

## 2016-12-30 NOTE — Patient Instructions (Signed)
YOU HAD AN ENDOSCOPIC PROCEDURE TODAY AT [facility]:   Refer to the procedure report that was given to you for any specific questions about what was found during the examination.  If the procedure report does not answer your questions, please call your gastroenterologist to clarify.  If you requested that your care partner not be given the details of your procedure findings, then the procedure report has been included in a sealed envelope for you to review at your convenience later.  YOU SHOULD EXPECT: Some feelings of bloating in the abdomen. Passage of more gas than usual.  Walking can help get rid of the air that was put into your GI tract during the procedure and reduce the bloating. If you had a lower endoscopy (such as a colonoscopy or flexible sigmoidoscopy) you may notice spotting of blood in your stool or on the toilet paper. If you underwent a bowel prep for your procedure, you may not have a normal bowel movement for a few days.  Please Note:  You might notice some irritation and congestion in your nose or some drainage.  This is from the oxygen used during your procedure.  There is no need for concern and it should clear up in a day or so.  SYMPTOMS TO REPORT IMMEDIATELY:   Following lower endoscopy (colonoscopy or flexible sigmoidoscopy):  Excessive amounts of blood in the stool  Significant tenderness or worsening of abdominal pains  Swelling of the abdomen that is new, acute  Fever of 100F or higher  For urgent or emergent issues, a gastroenterologist can be reached at any hour by calling [phone].   DIET:  We do recommend a small meal at first, but then you may proceed to your regular diet.  Drink plenty of fluids but you should avoid alcoholic beverages for 24 hours.  MEDICATIONS:  Continue present medications.  ACTIVITY:  You should plan to take it easy for the rest of today and you should NOT DRIVE or use heavy machinery until tomorrow (because of the  sedation medicines used during the test).    FOLLOW UP: Our staff will call the number listed on your records the next business day following your procedure to check on you and address any questions or concerns that you may have regarding the information given to you following your procedure. If we do not reach you, we will leave a message.  However, if you are feeling well and you are not experiencing any problems, there is no need to return our call.  We will assume that you have returned to your regular daily activities without incident.  If any biopsies were taken you will be contacted by phone or by letter within the next 1-3 weeks.  Please call us at [phone] if you have not heard about the biopsies in 3 weeks.   Please see hand-outs given to you by your recovery nurse to include diverticulosis, polyps, and high-fiber diet.  Thank you for allowing [name] to provide for your healthcare needs today.   SIGNATURES/CONFIDENTIALITY: You and/or your care partner have signed paperwork which will be entered into your electronic medical record.  These signatures attest to the fact that that the information above on your After Visit Summary has been reviewed and is understood.  Full responsibility of the confidentiality of this discharge information lies with you and/or your care-partner.

## 2016-12-30 NOTE — Op Note (Signed)
[place] Patient Name: Crystal Buchanan Procedure Date: 12/30/2016 12:56 PM MRN: [id number] Endoscopist: Docia Chuck. Henrene Pastor , MD Age: 56 Referring MD:  Date of Birth: [birth year] Gender: Female Account #: [id number] Procedure:                Colonoscopy, with cold snare polypectomy X2 Indications:              Positive Cologuard test Medicines:                Monitored Anesthesia Care Procedure:                Pre-Anesthesia Assessment:                           - Prior to the procedure, a History and Physical                            was performed, and patient medications and                            allergies were reviewed. The patient's tolerance of                            previous anesthesia was also reviewed. The risks                            and benefits of the procedure and the sedation                            options and risks were discussed with the patient.                            All questions were answered, and informed consent                            was obtained. Prior Anticoagulants: The patient has                            taken no previous anticoagulant or antiplatelet                            agents. ASA Grade Assessment: II - A patient with                            mild systemic disease. After reviewing the risks                            and benefits, the patient was deemed in                            satisfactory condition to undergo the procedure.                           After obtaining informed consent, the colonoscope  was passed under direct vision. Throughout the                            procedure, the patient's blood pressure, pulse, and                            oxygen saturations were monitored continuously. The                            Colonoscope was introduced through the anus and                            advanced to the the cecum, identified by                            appendiceal  orifice and ileocecal valve. The                            ileocecal valve, appendiceal orifice, and rectum                            were photographed. The quality of the bowel                            preparation was excellent. The colonoscopy was                            performed without difficulty. The patient tolerated                            the procedure well. The bowel preparation used was                            SUPREP. Scope In: 1:05:51 PM Scope Out: 1:26:52 PM Scope Withdrawal Time: 0 hours 17 minutes 11 seconds  Total Procedure Duration: 0 hours 21 minutes 1 second  Findings:                 Two polyps were found in the descending colon and                            ascending colon. The polyps were 1 to 2 mm in size.                            These polyps were removed with a cold snare.                            Resection and retrieval were complete.                           Multiple small and large-mouthed diverticula were                            found in the entire colon.  The exam was otherwise without abnormality on                            direct and retroflexion views. Complications:            No immediate complications. Estimated blood loss:                            None. Estimated Blood Loss:     Estimated blood loss: none. Impression:               - Two 1 to 2 mm polyps in the descending colon and                            in the ascending colon, removed with a cold snare.                            Resected and retrieved.                           - Diverticulosis in the entire examined colon.                           - The examination was otherwise normal on direct                            and retroflexion views. Recommendation:           - Repeat colonoscopy in 5-10 years for surveillance.                           - Patient has a contact number available for                            emergencies. The signs  and symptoms of potential                            delayed complications were discussed with the                            patient. Return to normal activities tomorrow.                            Written discharge instructions were provided to the                            patient.                           - Resume previous diet.                           - Continue present medications.                           - Await pathology results. Docia Chuck. Henrene Pastor, MD 12/30/2016 1:31:13 PM This report  has been signed electronically.

## 2016-12-31 ENCOUNTER — Telehealth: Payer: Self-pay | Admitting: *Deleted

## 2016-12-31 NOTE — Telephone Encounter (Signed)
  Follow up Call-  Call back number 12/30/2016  Post procedure Call Back phone  # [phone]  Permission to leave phone message Yes  Some recent data might be hidden     Patient questions:  Do you have a fever, pain , or abdominal swelling? No. Pain Score  0 *  Have you tolerated food without any problems? Yes.    Have you been able to return to your normal activities? Yes.    Do you have any questions about your discharge instructions: Diet   No. Medications  No. Follow up visit  No.  Do you have questions or concerns about your Care? No.  Actions: * If pain score is 4 or above: No action needed, pain <4.

## 2017-01-06 ENCOUNTER — Encounter: Payer: Self-pay | Admitting: Internal Medicine

## 2017-02-02 ENCOUNTER — Ambulatory Visit (INDEPENDENT_AMBULATORY_CARE_PROVIDER_SITE_OTHER): Payer: Medicare Other | Admitting: Family Medicine

## 2017-02-02 ENCOUNTER — Encounter: Payer: Self-pay | Admitting: Family Medicine

## 2017-02-02 VITALS — BP 122/82 | HR 76 | Temp 98.2°F | Resp 17 | Ht 67.0 in | Wt 216.0 lb

## 2017-02-02 DIAGNOSIS — E119 Type 2 diabetes mellitus without complications: Secondary | ICD-10-CM

## 2017-02-02 DIAGNOSIS — E785 Hyperlipidemia, unspecified: Secondary | ICD-10-CM | POA: Diagnosis not present

## 2017-02-02 LAB — BASIC METABOLIC PANEL
BUN: 13 mg/dL (ref 6–23)
CALCIUM: 9.3 mg/dL (ref 8.4–10.5)
CO2: 30 mEq/L (ref 19–32)
CREATININE: 0.81 mg/dL (ref 0.40–1.20)
Chloride: 104 mEq/L (ref 96–112)
GFR: 94.69 mL/min (ref >60.00)
Glucose, Bld: 118 mg/dL — ABNORMAL HIGH (ref 70–99)
Potassium: 4.2 mEq/L (ref 3.5–5.1)
Sodium: 140 mEq/L (ref 135–145)

## 2017-02-02 LAB — LIPID PANEL
Cholesterol: 170 mg/dL (ref 0–200)
HDL: 55.2 mg/dL (ref >39.00)
LDL CALC: 87 mg/dL (ref 0–99)
NONHDL: 115.06
Total CHOL/HDL Ratio: 3
Triglycerides: 141 mg/dL (ref 0.0–149.0)
VLDL: 28.2 mg/dL (ref 0.0–40.0)

## 2017-02-02 LAB — HEPATIC FUNCTION PANEL
ALBUMIN: 4.2 g/dL (ref 3.5–5.2)
ALT: 17 U/L (ref 0–35)
AST: 20 U/L (ref 0–37)
Alkaline Phosphatase: 63 U/L (ref 39–117)
BILIRUBIN DIRECT: 0.1 mg/dL (ref 0.0–0.3)
Total Bilirubin: 0.6 mg/dL (ref 0.2–1.2)
Total Protein: 7.7 g/dL (ref 6.0–8.3)

## 2017-02-02 LAB — MICROALBUMIN / CREATININE URINE RATIO
CREATININE, U: 184.7 mg/dL
MICROALB UR: 6.1 mg/dL — AB (ref 0.0–1.9)
Microalb Creat Ratio: 3.3 mg/g (ref 0.0–30.0)

## 2017-02-02 LAB — HEMOGLOBIN A1C: Hgb A1c MFr Bld: 7.3 % — ABNORMAL HIGH (ref 4.6–6.5)

## 2017-02-02 NOTE — Assessment & Plan Note (Signed)
Chronic problem.  Tolerating Crestor w/o difficulty.  Unable to exercise but attempting to eat well.  Check labs.  Adjust meds prn.

## 2017-02-02 NOTE — Progress Notes (Signed)
Pre visit review using our clinic review tool, if applicable. No additional management support is needed unless otherwise documented below in the visit note. 

## 2017-02-02 NOTE — Patient Instructions (Signed)
Follow up with me in 6 months to recheck sugar and cholesterol and schedule your medicare wellness visit w/ Maudie Mercury around the same time [facility] notify you of your lab results and make any changes if needed Continue to work on healthy diet and regular exercise- you look great! Call and schedule your eye exam! Call with any questions or concerns [place]!!!

## 2017-02-02 NOTE — Progress Notes (Signed)
   Subjective:    Patient ID: Crystal Buchanan, female    DOB: 04-21-1961, 56 y.o.   MRN: [id number]  HPI Hyperlipidemia- on Crestor 20mg  daily.  On 3/13 she told Dr Blanch Media office that she was not taking her meds.  Today she tells me she is.  Pt not exercising regularly due to chronic back and leg pain.  Denies CP, SOB, abd pain, N/V, HAs, visual changes.  DM- noted on last labs.  Due for microalbumin.  Due for eye exam- last done 2 yrs ago.  Pt has chronic numbness/tingling of feet due to back issues.   Review of Systems For ROS see HPI     Objective:   Physical Exam  Constitutional: She is oriented to person, place, and time. She appears well-developed and well-nourished. No distress.  HENT:  Head: Normocephalic and atraumatic.  Eyes: Conjunctivae and EOM are normal. Pupils are equal, round, and reactive to light.  Neck: Normal range of motion. Neck supple. Thyromegaly present.  Cardiovascular: Normal rate, regular rhythm, normal heart sounds and intact distal pulses.   No murmur heard. Pulmonary/Chest: Effort normal and breath sounds normal. No respiratory distress.  Abdominal: Soft. She exhibits no distension. There is no tenderness.  Musculoskeletal: She exhibits no edema.  Lymphadenopathy:    She has no cervical adenopathy.  Neurological: She is alert and oriented to person, place, and time.  Skin: Skin is warm and dry.  Psychiatric: She has a normal mood and affect. Her behavior is normal.  Vitals reviewed.         Assessment & Plan:

## 2017-02-02 NOTE — Assessment & Plan Note (Signed)
New dx at last visit.  Pt is due for eye exam- she plans to schedule.  Foot exam done today.  Get Microalbumin.  Check labs.  Start meds prn.  Stressed need for low carb diet and exercise as able.  Will follow closely.

## 2017-02-03 ENCOUNTER — Encounter: Payer: Self-pay | Admitting: General Practice

## 2017-05-14 ENCOUNTER — Telehealth: Payer: Self-pay

## 2017-05-14 NOTE — Telephone Encounter (Signed)
I called pt and reminded her to bring her cpap to the appt with Dr. Rexene Alberts on Monday. Pt verbalized understanding.

## 2017-05-18 ENCOUNTER — Ambulatory Visit (INDEPENDENT_AMBULATORY_CARE_PROVIDER_SITE_OTHER): Payer: Medicare Other | Admitting: Neurology

## 2017-05-18 ENCOUNTER — Encounter: Payer: Self-pay | Admitting: Neurology

## 2017-05-18 VITALS — BP 147/93 | HR 81 | Ht 67.5 in | Wt 210.0 lb

## 2017-05-18 DIAGNOSIS — Z9989 Dependence on other enabling machines and devices: Secondary | ICD-10-CM

## 2017-05-18 DIAGNOSIS — G4733 Obstructive sleep apnea (adult) (pediatric): Secondary | ICD-10-CM | POA: Diagnosis not present

## 2017-05-18 NOTE — Patient Instructions (Signed)
Please continue using your CPAP regularly. While your insurance requires that you use CPAP at least 4 hours each night on 70% of the nights, I recommend, that you not skip any nights and use it throughout the night if you can. Getting used to CPAP and staying with the treatment long term does take time and patience and discipline. Untreated obstructive sleep apnea when it is moderate to severe can have an adverse impact on cardiovascular health and raise her risk for heart disease, arrhythmias, hypertension, congestive heart failure, stroke and diabetes. Untreated obstructive sleep apnea causes sleep disruption, nonrestorative sleep, and sleep deprivation. This can have an impact on your day to day functioning and cause daytime sleepiness and impairment of cognitive function, memory loss, mood disturbance, and problems focussing. Using CPAP regularly can improve these symptoms.  Keep trying your CPAP and work on weight loss. Please stop by and drop off your SD card from the CPAP machine so we can download the data and we can send the chip back to you in the mail.

## 2017-05-18 NOTE — Progress Notes (Signed)
Subjective:    Patient ID: Crystal Buchanan is a 56 y.o. female.  HPI     Interim history:   Ms. Crystal Buchanan is a very pleasant 56 year old right-handed woman with an underlying medical history of chronic low back pain and failed back surgery syndrome, on chronic narcotic pain medication, who presents for followup consultation of her obstructive sleep apnea, on treatment with CPAP. The patient is unaccompanied today. I last saw her on 05/14/2016, at which time she reported that she had not been using her CPAP in about 2 months because she needed supplies. I advised her to let us know when this would occur again because she should not go without treatment just on account of not having supplies. I renewed her supply order at the time and suggested one-year checkup.  Today, 05/18/2017 (all dictated new, as well as above notes, some dictation done in note pad or Word, outside of chart, may appear as copied):  I could not review her CPAP compliance data as she forgot her SD card. She reports difficulty using CPAP as she does not sleep well at night, she is particularly bothered by back pain. She sees Dr. Brien Few and right foot pain for which she sees a podiatrist. She had injection into her right foot and also a course of meloxicam. May need foot surgery. She also may need to spinal cord stimulator as I understand for her back pain. She takes generic Percocet as needed but was advised by Dr. Brien Few to avoid after 6 PM. She has lost a little bit of weight, compared to 2014 when she had her sleep study she weighed 214 pounds, currently 210 pounds. She is trying to lose weight. She has not been able to exercise very much due to back pain and foot pain.   The patient's allergies, current medications, family history, past medical history, past social history, past surgical history and problem list were reviewed and updated as appropriate.   Previously (copied from previous notes for reference):   I saw her on  05/15/2015, at which time she was compliant with CPAP. She had ongoing issues with right knee pain. She was on narcotic pain medication for knee pain and back pain. She was seeing a doctor as well. I suggested from my end of things a one-year checkup for sleep apnea on CPAP compliance.   I saw her on 05/11/2014, at which time she reported feeling stable. She was feeling better after CPAP therapy. She had been started on Xanax from her primary care physician for anxiety. She was reporting right knee pain after a fall, and had a follow-up appointment with her doctor for that.   I reviewed her CPAP compliance data from 04/13/2015 through 05/12/2015 which is a total of 30 days during which time she used her machine 26 days with percent used days greater than 4 hours at 87%, indicating very good compliance with an average usage of 4 hours and 14 minutes, residual AHI low at 0.3 per hour, leaked low with the 95th percentile at 0.9 L/m, on a pressure of 6 cm with EPR of 2.   I saw her on 11/08/2013, at which time I reviewed her compliance data and her sleep study results. She reported sleeping better, with more consolidated sleep, no residual snoring, and improved restlessness. She had an epidural injection under Dr. Brien Few.   She endorses restless leg symptoms and has a history of iron deficiency. I checked iron studies and we called her back with the results.  She reported needing surgery for her left knee and surgery for fibroids. Her ferritin was 12 and she was advised to increase her iron pills to one pill 3 times a day from twice daily. In the interim, on 12/16/2013 she had resection of a cervical mass.   I reviewed her compliance data from 04/03/2014 through 05/02/2014, which is a total of 30 days during which time she is CPAP every night except for 2 nights, percent used days greater than 4 hours was 90%, average usage of 5 hours, AHI residual at 0.3 per hour, at goal, and leak was low at 1.1 L per minute  for the 95th percentile, pressure at 6 cm with EPR of 2.     I first met her on 06/23/2013 at the request of her pain management doctor, Dr. Brien Few. She had a home sleep test in his office showing an AHI of 17.2 per hour. I suggested that she return for sleep study. She had a baseline sleep study on 07/10/2013 and I went over her test results with her in detail today. I also explained to her her second sleep study which was a CPAP titration study on 08/23/2013. Her baseline sleep study from 07/10/2013 showed a sleep efficiency of 41.3% only. She had a markedly prolonged sleep latency at 116 minutes and a high amount of wake after sleep onset at 158 minutes with moderate sleep fragmentation noted. She had increased percentage of light stage sleep, decreased percentage of slow-wave sleep and a decreased percentage of REM sleep was highly prolonged REM latency. She had mild intermittent snoring. She only slept on her sides. She had a total AHI of 10.6 per hour, rising to 13.8 per hour and REM sleep. Her baseline oxygen saturation was 93% on her nadir was 87%. Her CPAP titration study from 08/23/2013 showed a sleep efficiency of 32.5% only. Sleep latency was 62 minutes and wake after sleep onset was high at 194.5 minutes with mild sleep fragmentation noted and she was essentially unable to achieve sleep after 3 AM. Her arousal index was normal. She had an increased percentage of light stage sleep, absence of deep sleep, and a decreased percentage of REM sleep with prolonged REM latency. Her baseline oxygen saturation was 94%, her nadir was 89%. CPAP was initiated at 5 cm and titrated to 6 cm on which were AHI was 0.7 per hour. She did not have any significant periodic leg movements of sleep in either study. Based on the test results I suggested she start CPAP treatment at 6 cm of water pressure.   I reviewed her compliance data from 10/04/2013 through 11/02/2013 which is a total of 30 days during which time she used  CPAP every night except for 2 nights. Percent used days greater than 4 hours was 93%, indicating excellent compliance. Her pressure is 6 cm with a PR of 2, her average usage was 5 hours and 35 minutes, her residual AHI is low at 0.5 per hour, and her leak was very low.   Her Past Medical History Is Significant For: Past Medical History:  Diagnosis Date  . Anemia   . Anxiety   . Arthritis   . Chicken pox   . GERD (gastroesophageal reflux disease)   . Hyperlipidemia   . Migraine   . Neuropathy    right foot  . OSA on CPAP   . Seasonal allergies   . Sleep apnea   . Thyroid disease     Her Past Surgical History Is  Significant For: Past Surgical History:  Procedure Laterality Date  . BREAST SURGERY    . CERVICAL CONIZATION W/BX N/A 12/16/2013   Procedure: resection of cervical mass;  Surgeon: Marylynn Pearson, MD;  Location: [facility];  Service: Gynecology;  Laterality: N/A;  resection of cx mass-MD wants to use cold knife cone instrruments for this case  . KNEE ARTHROSCOPY  1998  . [place] SURGERY  02-26-12  . TUBAL LIGATION      Her Family History Is Significant For: Family History  Problem Relation Age of Onset  . Heart disease Mother   . Diabetes Mother   . Hypertension Mother   . Arthritis Father   . Hyperlipidemia Father   . Heart disease Father   . Stroke Father   . Hypertension Father   . Diabetes Father   . Hypertension Sister   . Hypertension Sister   . Colon cancer Neg Hx   . Stomach cancer Neg Hx   . Esophageal cancer Neg Hx   . Rectal cancer Neg Hx   . Liver cancer Neg Hx     Her Social History Is Significant For: Social History   Social History  . Marital status: Married    Spouse name: N/A  . Number of children: 3  . Years of education: 12th   Occupational History  . N/A    Social History Main Topics  . Smoking status: Never Smoker  . Smokeless tobacco: Never Used  . Alcohol use No  . Drug use: No  . Sexual activity: Not Asked   Other  Topics Concern  . None   Social History Narrative  . None    Her Allergies Are:  Allergies  Allergen Reactions  . Atorvastatin Nausea Only    Along with stomachaches  . Lyrica [Pregabalin]     Dizziness,blurred vision,increased appetite,h/a  . Codeine Palpitations  . Gabapentin Rash    Itching and nausa  :   Her Current Medications Are:  Outpatient Encounter Prescriptions as of 05/18/2017  Medication Sig  . DULoxetine (CYMBALTA) 30 MG capsule Take 30 mg by mouth daily.  . ferrous sulfate 325 (65 FE) MG tablet Take 325 mg by mouth 2 (two) times daily.  . methocarbamol (ROBAXIN) 500 MG tablet Take 500 mg by mouth daily.  Marland Kitchen oxyCODONE-acetaminophen (PERCOCET/ROXICET) 5-325 MG per tablet Take 1 tablet by mouth every 6 (six) hours as needed for pain.  . pantoprazole (PROTONIX) 40 MG tablet Take 1 tablet (40 mg total) by mouth daily.  . rosuvastatin (CRESTOR) 20 MG tablet Take 1 tablet (20 mg total) by mouth daily.  Marland Kitchen tiZANidine (ZANAFLEX) 2 MG tablet Take 2 mg by mouth every 6 (six) hours as needed.   Facility-Administered Encounter Medications as of 05/18/2017  Medication  . 0.9 %  sodium chloride infusion  :  Review of Systems:  Out of a complete 14 point review of systems, all are reviewed and negative with the exception of these symptoms as listed below:  Review of Systems  Neurological:       Pt presents today to discuss her cpap. Pt reports that she has not used her cpap recently because she has not been sleeping enough to use it. Pt has back and leg pain. Pt forgot to bring her cpap today. Pt is using [employer].   Objective:  Neurological Exam  Physical Exam Physical Examination:   Vitals:   05/18/17 1133  BP: (!) 147/93  Pulse: 81   General Examination: The patient is  a very pleasant 56 y.o. female in no acute distress. She appears well-developed and well-nourished and well groomed.   HEENT: Normocephalic, atraumatic, pupils are equal, round and reactive to light  and accommodation. Extraocular tracking is good without limitation to gaze excursion or nystagmus noted. Normal smooth pursuit is noted. Hearing is grossly intact. Face is symmetric with normal facial animation and normal facial sensation. Speech is clear with no dysarthria noted. There is no hypophonia. There is no lip, neck/head, jaw or voice tremor. Neck is supple with full range of passive and active motion. There are no carotid bruits on auscultation. Oropharynx exam reveals: mild mouth dryness, good dental hygiene and moderate airway crowding. Mallampati is class III. Tongue protrudes centrally and palate elevates symmetrically. She has an underbite.    Chest: Clear to auscultation without wheezing, rhonchi or crackles noted.  Heart: S1+S2+0, regular and normal without murmurs, rubs or gallops noted.   Abdomen: Soft, non-tender and non-distended with normal bowel sounds appreciated on auscultation.  Extremities: There is trace edema R ankle.   Skin: Warm and dry without trophic changes noted. There are no varicose veins.   Musculoskeletal: exam reveals no obvious joint deformities, tenderness or joint swelling or erythema, except for lower and midback pain reported and R foot pain.  Neurologically:  Mental status: The patient is awake, alert and oriented in all 4 spheres. Her memory, attention, language and knowledge are appropriate. There is no aphasia, agnosia, apraxia or anomia. Speech is clear with normal prosody and enunciation. Thought process is linear. Mood is congruent and affect is normal.  Cranial nerves are as described above under HEENT exam. In addition, shoulder shrug is normal with equal shoulder height noted. Motor exam: Normal bulk, strength and tone is noted, except for pain limitation. There is no drift, resting tremor or rebound. Romberg is negative. Reflexes are 1+ throughout. Fine motor skills are intact with normal finger taps, normal hand movements, normal rapid  alternating patting, normal foot taps and normal foot agility.  Cerebellar testing shows no dysmetria or intention tremor on finger to nose testing.   Sensory exam: intact to LT.  Gait, station and balance: she stands up with mild difficulty and has to push herself up. She walks with a limp on the R. She brought her cane. No veering to one side is noted. No leaning to one side is noted. Posture is age-appropriate and stance is wide based. No problems turning are noted. Tandem walk is not possible.   Assessment and Plan:   In summary, Kiva Norland is a very pleasant 56 year old female with an underlying medical history of chronic low back pain and failed back surgery syndrome, on chronic narcotic pain medication, b/l knee pain, b/l foot pain, s/p L knee surgery and right foot arthritis, who presents for follow up consultation of her OSA. She has a CPAP machine but has not been using it, On a regular basis, I could not review of compliance download today because she forgot her compliance data chip. She is encouraged to bring it back when she is in this area for another appointment or for another reason and she could just drop it off at the front desk and after downloading the data we can mail to check back to her. She reports difficulty maintaining sleep because of back pain and foot pain in particular. Alternative treatments are limited. She is unlikely to be a candidate for an oral appliance because of an underbite. She certainly does not wish to  pursue surgical intervention and overall her sleep apnea was in the mild range and does not really warrant surgical evaluation. Nevertheless, I did encourage her to continue to work on weight loss which will help and to try to continue to use CPAP as best as she can. We will recheck her symptoms and compliance next year around this time. She can see one of our nurse practitioners. I will in the interim make an addendum once we have compliance data available from  her SD card. I answered all her questions today and she was in agreement with the plan.  I spent 20 minutes in total face-to-face time with the patient, more than 50% of which was spent in counseling and coordination of care, reviewing test results, reviewing medication and discussing or reviewing the diagnosis of OSA, its prognosis and treatment options. Pertinent laboratory and imaging test results that were available during this visit with the patient were reviewed by me and considered in my medical decision making (see chart for details).

## 2017-08-04 NOTE — Progress Notes (Addendum)
Subjective:   Crystal Buchanan is a 56 y.o. female who presents for Medicare Annual (Subsequent) preventive examination.  Review of Systems:  No ROS.  Medicare Wellness Visit. Additional risk factors are reflected in the social history.  Cardiac Risk Factors include: dyslipidemia;family history of premature cardiovascular disease;sedentary lifestyle;obesity (BMI >30kg/m2)   Sleep patterns: Sleeps about 2 hours at a time, uses CPAP. Home Safety/Smoke Alarms: Feels safe in home. Smoke alarms in place.  Living environment; residence and Firearm Safety: Lives with husband in 1 story apt.  Seat Belt Safety/Bike Helmet: Wears seat belt.   Female:   Pap-10/04/2016, followed by GYN.      Mammo-10/04/2016, normal.        Dexa scan-N/A      [facility]-Colonoscopy 12/30/2016, polyps. Recall 10 years.      Objective:     Vitals: BP 132/80 (BP Location: Left Arm, Patient Position: Sitting, Cuff Size: Large)   Pulse 66   Temp 98 F (36.7 C) (Temporal)   Resp 18   Ht 5\' 8"  (1.727 m)   Wt 209 lb 9.6 oz (95.1 kg)   SpO2 98%   BMI 31.87 kg/m   Body mass index is 31.87 kg/m.   Tobacco History  Smoking Status  . Never Smoker  Smokeless Tobacco  . Never Used     Counseling given: Not Answered   Past Medical History:  Diagnosis Date  . Anemia   . Anxiety   . Arthritis   . Chicken pox   . GERD (gastroesophageal reflux disease)   . Hyperlipidemia   . Migraine   . Neuropathy    right foot  . OSA on CPAP   . Seasonal allergies   . Sleep apnea   . Thyroid disease    Past Surgical History:  Procedure Laterality Date  . BREAST SURGERY    . CERVICAL CONIZATION W/BX N/A 12/16/2013   Procedure: resection of cervical mass;  Surgeon: Marylynn Pearson, MD;  Location: [facility];  Service: Gynecology;  Laterality: N/A;  resection of cx mass-MD wants to use cold knife cone instrruments for this case  . KNEE ARTHROSCOPY  1998  . [place] SURGERY  02-26-12  . TUBAL LIGATION     Family History    Problem Relation Age of Onset  . Heart disease Mother   . Diabetes Mother   . Hypertension Mother   . Arthritis Father   . Hyperlipidemia Father   . Heart disease Father   . Stroke Father   . Hypertension Father   . Diabetes Father   . Hypertension Sister   . Hypertension Sister   . Colon cancer Neg Hx   . Stomach cancer Neg Hx   . Esophageal cancer Neg Hx   . Rectal cancer Neg Hx   . Liver cancer Neg Hx    History  Sexual Activity  . Sexual activity: Not on file    Outpatient Encounter Prescriptions as of 08/05/2017  Medication Sig  . DULoxetine (CYMBALTA) 30 MG capsule Take 30 mg by mouth daily.  . methocarbamol (ROBAXIN) 500 MG tablet Take 500 mg by mouth daily.  Marland Kitchen oxyCODONE-acetaminophen (PERCOCET/ROXICET) 5-325 MG per tablet Take 1 tablet by mouth every 6 (six) hours as needed for pain.  . pantoprazole (PROTONIX) 40 MG tablet Take 1 tablet (40 mg total) by mouth daily.  . rosuvastatin (CRESTOR) 20 MG tablet Take 1 tablet (20 mg total) by mouth daily.  Marland Kitchen tiZANidine (ZANAFLEX) 2 MG tablet Take 2 mg by mouth  every 6 (six) hours as needed.  . ferrous sulfate 325 (65 FE) MG tablet Take 325 mg by mouth 2 (two) times daily.   Facility-Administered Encounter Medications as of 08/05/2017  Medication  . 0.9 %  sodium chloride infusion    Activities of Daily Living In your present state of health, do you have any difficulty performing the following activities: 08/05/2017  Hearing? N  Vision? N  Difficulty concentrating or making decisions? Y  Comment concentration problems x 5 years.   Walking or climbing stairs? Y  Comment Walks with cane d/t right leg pain/weakness.   Dressing or bathing? N  Doing errands, shopping? N  Preparing Food and eating ? N  Using the Toilet? N  In the past six months, have you accidently leaked urine? N  Do you have problems with loss of bowel control? N  Managing your Medications? N  Managing your Finances? N  Housekeeping or managing  your Housekeeping? N  Some recent data might be hidden    Patient Care Team: [name], MD as PCP - General (Family Medicine) Marlaine Hind, MD as Consulting Physician (Physical Medicine and Rehabilitation) Star Age, MD as Attending Physician (Neurology) Marylynn Pearson, MD as Consulting Physician (Obstetrics and Gynecology) Inocencio Homes, DPM as Consulting Physician (Podiatry)    Assessment:    Physical assessment deferred to PCP.  Exercise Activities and Dietary recommendations Current Exercise Habits: The patient does not participate in regular exercise at present, Exercise limited by: orthopedic condition(s)   Diet (meal preparation, eat out, water intake, caffeinated beverages, dairy products, fruits and vegetables): Drinks water and juices.   Breakfast: Coffee Lunch: skips Dinner: lean protein and vegetables  Discussed diabetic/heart healthy diet and importance of not skipping meals.   Goals    . Weight (lb) < 180 lb (81.6 kg)          Lose weight by eating healthier.       Fall Risk Fall Risk  08/05/2017 08/04/2016 10/28/2013  Falls in the past year? Yes No No  Number falls in past yr: 2 or more - -  Injury with Fall? No - -  Risk for fall due to : Impaired mobility - -  Follow up Falls prevention discussed - -   Depression Screen PHQ 2/9 Scores 08/05/2017 02/02/2017 08/04/2016 10/28/2013  PHQ - 2 Score 2 0 1 0  PHQ- 9 Score 14 0 - -     Cognitive Function       Ad8 score reviewed for issues:  Issues making decisions: no  Less interest in hobbies / activities: no  Repeats questions, stories (family complaining): no  Trouble using ordinary gadgets (microwave, computer, phone): no  Forgets the month or year: no  Mismanaging finances: no  Remembering appts: no  Daily problems with thinking and/or memory: no Ad8 score is=0      There is no immunization history on file for this patient. Screening Tests Health Maintenance  Topic  Date Due  . OPHTHALMOLOGY EXAM  11/24/1972  . HEMOGLOBIN A1C  08/04/2017  . INFLUENZA VACCINE  01/17/2018 (Originally 05/20/2017)  . PNEUMOCOCCAL POLYSACCHARIDE VACCINE (1) 07/20/2018 (Originally 11/24/1964)  . TETANUS/TDAP  07/21/2019 (Originally 11/24/1981)  . Hepatitis C Screening  07/21/2019 (Originally 1961/10/14)  . HIV Screening  07/21/2019 (Originally 11/24/1977)  . FOOT EXAM  02/02/2018  . URINE MICROALBUMIN  02/02/2018  . MAMMOGRAM  10/04/2018  . PAP SMEAR  10/05/2019  . COLONOSCOPY  12/31/2026      Plan:  Schedule eye exam.   Bring a copy of your living will and/or healthcare power of attorney to your next office visit.   I have personally reviewed and noted the following in the patient's chart:   . Medical and social history . Use of alcohol, tobacco or illicit drugs  . Current medications and supplements . Functional ability and status . Nutritional status . Physical activity . Advanced directives . List of other physicians . Hospitalizations, surgeries, and ER visits in previous 12 months . Vitals . Screenings to include cognitive, depression, and falls . Referrals and appointments  In addition, I have reviewed and discussed with patient certain preventive protocols, quality metrics, and best practice recommendations. A written personalized care plan for preventive services as well as general preventive health recommendations were provided to patient.     Gerilyn Nestle, RN  08/05/2017  Reviewed documentation and agree w/ above.  Annye Asa, MD

## 2017-08-05 ENCOUNTER — Ambulatory Visit: Payer: Medicare Other

## 2017-08-05 ENCOUNTER — Ambulatory Visit (INDEPENDENT_AMBULATORY_CARE_PROVIDER_SITE_OTHER): Payer: Medicare Other | Admitting: Family Medicine

## 2017-08-05 ENCOUNTER — Encounter: Payer: Self-pay | Admitting: Family Medicine

## 2017-08-05 VITALS — BP 132/80 | HR 66 | Temp 98.0°F | Resp 18 | Ht 68.0 in | Wt 209.6 lb

## 2017-08-05 DIAGNOSIS — F418 Other specified anxiety disorders: Secondary | ICD-10-CM

## 2017-08-05 DIAGNOSIS — E119 Type 2 diabetes mellitus without complications: Secondary | ICD-10-CM | POA: Diagnosis not present

## 2017-08-05 DIAGNOSIS — Z0000 Encounter for general adult medical examination without abnormal findings: Secondary | ICD-10-CM | POA: Diagnosis not present

## 2017-08-05 DIAGNOSIS — E785 Hyperlipidemia, unspecified: Secondary | ICD-10-CM

## 2017-08-05 DIAGNOSIS — K219 Gastro-esophageal reflux disease without esophagitis: Secondary | ICD-10-CM

## 2017-08-05 LAB — LIPID PANEL
CHOL/HDL RATIO: 6
CHOLESTEROL: 276 mg/dL — AB (ref 0–200)
HDL: 49.9 mg/dL (ref >39.00)
LDL CALC: 191 mg/dL — AB (ref 0–99)
NonHDL: 226.58
TRIGLYCERIDES: 180 mg/dL — AB (ref 0.0–149.0)
VLDL: 36 mg/dL (ref 0.0–40.0)

## 2017-08-05 LAB — BASIC METABOLIC PANEL
BUN: 11 mg/dL (ref 6–23)
CALCIUM: 9.5 mg/dL (ref 8.4–10.5)
CO2: 28 mEq/L (ref 19–32)
Chloride: 104 mEq/L (ref 96–112)
Creatinine, Ser: 0.86 mg/dL (ref 0.40–1.20)
GFR: 88.2 mL/min (ref >60.00)
Glucose, Bld: 118 mg/dL — ABNORMAL HIGH (ref 70–99)
Potassium: 4 mEq/L (ref 3.5–5.1)
SODIUM: 140 meq/L (ref 135–145)

## 2017-08-05 LAB — HEPATIC FUNCTION PANEL
ALBUMIN: 4.3 g/dL (ref 3.5–5.2)
ALT: 14 U/L (ref 0–35)
AST: 18 U/L (ref 0–37)
Alkaline Phosphatase: 58 U/L (ref 39–117)
Bilirubin, Direct: 0.1 mg/dL (ref 0.0–0.3)
Total Bilirubin: 0.6 mg/dL (ref 0.2–1.2)
Total Protein: 7.6 g/dL (ref 6.0–8.3)

## 2017-08-05 LAB — CBC WITH DIFFERENTIAL/PLATELET
BASOS ABS: 0.1 10*3/uL (ref 0.0–0.1)
Basophils Relative: 1.1 % (ref 0.0–3.0)
Eosinophils Absolute: 0.2 10*3/uL (ref 0.0–0.7)
Eosinophils Relative: 3 % (ref 0.0–5.0)
HEMATOCRIT: 41.2 % (ref 36.0–46.0)
HEMOGLOBIN: 13.7 g/dL (ref 12.0–15.0)
LYMPHS PCT: 54.3 % — AB (ref 12.0–46.0)
Lymphs Abs: 3.3 10*3/uL (ref 0.7–4.0)
MCHC: 33.1 g/dL (ref 30.0–36.0)
MCV: 90.5 fl (ref 78.0–100.0)
MONOS PCT: 5.7 % (ref 3.0–12.0)
Monocytes Absolute: 0.3 10*3/uL (ref 0.1–1.0)
Neutro Abs: 2.2 10*3/uL (ref 1.4–7.7)
Neutrophils Relative %: 35.9 % — ABNORMAL LOW (ref 43.0–77.0)
Platelets: 284 10*3/uL (ref 150.0–400.0)
RBC: 4.55 Mil/uL (ref 3.87–5.11)
RDW: 14.3 % (ref 11.5–15.5)
WBC: 6.2 10*3/uL (ref 4.0–10.5)

## 2017-08-05 LAB — HEMOGLOBIN A1C: Hgb A1c MFr Bld: 6.6 % — ABNORMAL HIGH (ref 4.6–6.5)

## 2017-08-05 LAB — TSH: TSH: 2.12 u[IU]/mL (ref 0.35–4.50)

## 2017-08-05 MED ORDER — DULOXETINE HCL 60 MG PO CPEP: 60.0000 mg | ORAL_CAPSULE | Freq: Every day | ORAL | 3 refills | Status: DC

## 2017-08-05 MED ORDER — PANTOPRAZOLE SODIUM 40 MG PO TBEC: 40.0000 mg | DELAYED_RELEASE_TABLET | Freq: Every day | ORAL | 6 refills | Status: DC

## 2017-08-05 MED ORDER — ROSUVASTATIN CALCIUM 20 MG PO TABS: 20.0000 mg | ORAL_TABLET | Freq: Every day | ORAL | 6 refills | Status: DC

## 2017-08-05 NOTE — Assessment & Plan Note (Signed)
Chronic problem.  Not well controlled as evidenced by PHQ9 of 14.  Increase Cymbalta to 60mg  daily and monitor closely for improvement.  Pt expressed understanding and is in agreement w/ plan.

## 2017-08-05 NOTE — Assessment & Plan Note (Signed)
Deteriorated.  Pt ran out of meds and didn't notify the office.  Refill provided along w/ dietary and lifestyle education.  Will follow.

## 2017-08-05 NOTE — Assessment & Plan Note (Signed)
Chronic problem.  Tolerating statin w/o difficulty.  Check labs.  Adjust meds prn  

## 2017-08-05 NOTE — Assessment & Plan Note (Signed)
Chronic problem.  UTD on foot exam, microalbumin.  Overdue for eye exam- pt encouraged to schedule.  Check labs.  Adjust tx plan prn.  Stressed need for healthy diet and exercise as able.  Pt expressed understanding and is in agreement w/ plan.

## 2017-08-05 NOTE — Patient Instructions (Addendum)
Follow up in 6 weeks to recheck mood We'll notify you of your lab results and make any changes if needed Continue to work on healthy diet and exercise as able- you can do it! Call Dr Julien Girt office to schedule your mammogram Increase the Cymbalta to 83m daily- I sent in a new prescription for this Please schedule your eye exam Call with any questions or concerns Happy Fall!!!  Schedule eye exam.   Bring a copy of your living will and/or healthcare power of attorney to your next office visit.  Health Maintenance, Female Adopting a healthy lifestyle and getting preventive care can go a long way to promote health and wellness. Talk with your health care provider about what schedule of regular examinations is right for you. This is a good chance for you to check in with your provider about disease prevention and staying healthy. In between checkups, there are plenty of things you can do on your own. Experts have done a lot of research about which lifestyle changes and preventive measures are most likely to keep you healthy. Ask your health care provider for more information. Weight and diet Eat a healthy diet  Be sure to include plenty of vegetables, fruits, low-fat dairy products, and lean protein.  Do not eat a lot of foods high in solid fats, added sugars, or salt.  Get regular exercise. This is one of the most important things you can do for your health. ? Most adults should exercise for at least 150 minutes each week. The exercise should increase your heart rate and make you sweat (moderate-intensity exercise). ? Most adults should also do strengthening exercises at least twice a week. This is in addition to the moderate-intensity exercise.  Maintain a healthy weight  Body mass index (BMI) is a measurement that can be used to identify possible weight problems. It estimates body fat based on height and weight. Your health care provider can help determine your BMI and help you achieve or  maintain a healthy weight.  For females 266years of age and older: ? A BMI below 18.5 is considered underweight. ? A BMI of 18.5 to 24.9 is normal. ? A BMI of 25 to 29.9 is considered overweight. ? A BMI of 30 and above is considered obese.  Watch levels of cholesterol and blood lipids  You should start having your blood tested for lipids and cholesterol at 54years of age, then have this test every 5 years.  You may need to have your cholesterol levels checked more often if: ? Your lipid or cholesterol levels are high. ? You are older than 54years of age. ? You are at high risk for heart disease.  Cancer screening Lung Cancer  Lung cancer screening is recommended for adults [id number] old who are at high risk for lung cancer because of a history of smoking.  A yearly low-dose CT scan of the lungs is recommended for people who: ? Currently smoke. ? Have quit within the past 15 years. ? Have at least a 30-pack-year history of smoking. A pack year is smoking an average of one pack of cigarettes a day for 1 year.  Yearly screening should continue until it has been 15 years since you quit.  Yearly screening should stop if you develop a health problem that would prevent you from having lung cancer treatment.  Breast Cancer  Practice breast self-awareness. This means understanding how your breasts normally appear and feel.  It also means doing regular  breast self-exams. Let your health care provider know about any changes, no matter how small.  If you are in your 20s or 30s, you should have a clinical breast exam (CBE) by a health care provider every 1-3 years as part of a regular health exam.  If you are 66 or older, have a CBE every year. Also consider having a breast X-ray (mammogram) every year.  If you have a family history of breast cancer, talk to your health care provider about genetic screening.  If you are at high risk for breast cancer, talk to your health care  provider about having an MRI and a mammogram every year.  Breast cancer gene (BRCA) assessment is recommended for women who have family members with BRCA-related cancers. BRCA-related cancers include: ? Breast. ? Ovarian. ? Tubal. ? Peritoneal cancers.  Results of the assessment will determine the need for genetic counseling and BRCA1 and BRCA2 testing.  Cervical Cancer Your health care provider may recommend that you be screened regularly for cancer of the pelvic organs (ovaries, uterus, and vagina). This screening involves a pelvic examination, including checking for microscopic changes to the surface of your cervix (Pap test). You may be encouraged to have this screening done every 3 years, beginning at age 78.  For women ages 86-65, health care providers may recommend pelvic exams and Pap testing every 3 years, or they may recommend the Pap and pelvic exam, combined with testing for human papilloma virus (HPV), every 5 years. Some types of HPV increase your risk of cervical cancer. Testing for HPV may also be done on women of any age with unclear Pap test results.  Other health care providers may not recommend any screening for nonpregnant women who are considered low risk for pelvic cancer and who do not have symptoms. Ask your health care provider if a screening pelvic exam is right for you.  If you have had past treatment for cervical cancer or a condition that could lead to cancer, you need Pap tests and screening for cancer for at least 20 years after your treatment. If Pap tests have been discontinued, your risk factors (such as having a new sexual partner) need to be reassessed to determine if screening should resume. Some women have medical problems that increase the chance of getting cervical cancer. In these cases, your health care provider may recommend more frequent screening and Pap tests.  Colorectal Cancer  This type of cancer can be detected and often prevented.  Routine  colorectal cancer screening usually begins at 54 years of age and continues through 54 years of age.  Your health care provider may recommend screening at an earlier age if you have risk factors for colon cancer.  Your health care provider may also recommend using home test kits to check for hidden blood in the stool.  A small camera at the end of a tube can be used to examine your colon directly (sigmoidoscopy or colonoscopy). This is done to check for the earliest forms of colorectal cancer.  Routine screening usually begins at age 43.  Direct examination of the colon should be repeated every 5-10 years through 54 years of age. However, you may need to be screened more often if early forms of precancerous polyps or small growths are found.  Skin Cancer  Check your skin from head to toe regularly.  Tell your health care provider about any new moles or changes in moles, especially if there is a change in a mole's shape  or color.  Also tell your health care provider if you have a mole that is larger than the size of a pencil eraser.  Always use sunscreen. Apply sunscreen liberally and repeatedly throughout the day.  Protect yourself by wearing long sleeves, pants, a wide-brimmed hat, and sunglasses whenever you are outside.  Heart disease, diabetes, and high blood pressure  High blood pressure causes heart disease and increases the risk of stroke. High blood pressure is more likely to develop in: ? People who have blood pressure in the high end of the normal range (130-139/85-89 mm Hg). ? People who are overweight or obese. ? People who are African American.  If you are 37-38 years of age, have your blood pressure checked every 3-5 years. If you are 47 years of age or older, have your blood pressure checked every year. You should have your blood pressure measured twice-once when you are at a hospital or clinic, and once when you are not at a hospital or clinic. Record the average of the  two measurements. To check your blood pressure when you are not at a hospital or clinic, you can use: ? An automated blood pressure machine at a pharmacy. ? A home blood pressure monitor.  If you are between 32 years and 40 years old, ask your health care provider if you should take aspirin to prevent strokes.  Have regular diabetes screenings. This involves taking a blood sample to check your fasting blood sugar level. ? If you are at a normal weight and have a low risk for diabetes, have this test once every three years after 54 years of age. ? If you are overweight and have a high risk for diabetes, consider being tested at a younger age or more often. Preventing infection Hepatitis B  If you have a higher risk for hepatitis B, you should be screened for this virus. You are considered at high risk for hepatitis B if: ? You were born in a country where hepatitis B is common. Ask your health care provider which countries are considered high risk. ? Your parents were born in a high-risk country, and you have not been immunized against hepatitis B (hepatitis B vaccine). ? You have HIV or AIDS. ? You use needles to inject street drugs. ? You live with someone who has hepatitis B. ? You have had sex with someone who has hepatitis B. ? You get hemodialysis treatment. ? You take certain medicines for conditions, including cancer, organ transplantation, and autoimmune conditions.  Hepatitis C  Blood testing is recommended for: ? Everyone born from [birth year] through 1963. ? Anyone with known risk factors for hepatitis C.  Sexually transmitted infections (STIs)  You should be screened for sexually transmitted infections (STIs) including gonorrhea and chlamydia if: ? You are sexually active and are younger than 54 years of age. ? You are older than 54 years of age and your health care provider tells you that you are at risk for this type of infection. ? Your sexual activity has changed since you  were last screened and you are at an increased risk for chlamydia or gonorrhea. Ask your health care provider if you are at risk.  If you do not have HIV, but are at risk, it may be recommended that you take a prescription medicine daily to prevent HIV infection. This is called pre-exposure prophylaxis (PrEP). You are considered at risk if: ? You are sexually active and do not regularly use condoms or know the  HIV status of your partner(s). ? You take drugs by injection. ? You are sexually active with a partner who has HIV.  Talk with your health care provider about whether you are at high risk of being infected with HIV. If you choose to begin PrEP, you should first be tested for HIV. You should then be tested every 3 months for as long as you are taking PrEP. Pregnancy  If you are premenopausal and you may become pregnant, ask your health care provider about preconception counseling.  If you may become pregnant, take 400 to 800 micrograms (mcg) of folic acid every day.  If you want to prevent pregnancy, talk to your health care provider about birth control (contraception). Osteoporosis and menopause  Osteoporosis is a disease in which the bones lose minerals and strength with aging. This can result in serious bone fractures. Your risk for osteoporosis can be identified using a bone density scan.  If you are 104 years of age or older, or if you are at risk for osteoporosis and fractures, ask your health care provider if you should be screened.  Ask your health care provider whether you should take a calcium or vitamin D supplement to lower your risk for osteoporosis.  Menopause may have certain physical symptoms and risks.  Hormone replacement therapy may reduce some of these symptoms and risks. Talk to your health care provider about whether hormone replacement therapy is right for you. Follow these instructions at home:  Schedule regular health, dental, and eye exams.  Stay current  with your immunizations.  Do not use any tobacco products including cigarettes, chewing tobacco, or electronic cigarettes.  If you are pregnant, do not drink alcohol.  If you are breastfeeding, limit how much and how often you drink alcohol.  Limit alcohol intake to no more than 1 drink per day for nonpregnant women. One drink equals 12 ounces of beer, 5 ounces of wine, or 1 ounces of hard liquor.  Do not use street drugs.  Do not share needles.  Ask your health care provider for help if you need support or information about quitting drugs.  Tell your health care provider if you often feel depressed.  Tell your health care provider if you have ever been abused or do not feel safe at home. This information is not intended to replace advice given to you by your health care provider. Make sure you discuss any questions you have with your health care provider. Document Released: 04/21/2011 Document Revised: 03/13/2016 Document Reviewed: 07/10/2015 Elsevier Interactive Patient Education  [employer].

## 2017-08-05 NOTE — Progress Notes (Signed)
   Subjective:    Patient ID: Crystal Buchanan, female    DOB: 1961-05-24, 56 y.o.   MRN: [id number]  HPI Hyperlipidemia- chronic problem, on Crestor 20mg  daily.  Denies abd pain, N/V, CP, SOB, HAs, visual changes.  DM- chronic problem, diet controlled at this time but pt has not lost any weight and is not able to exercise due to chronic pain issues.  UTD on microalbumin, foot exam.  Overdue on eye exam.  Depression- pt's currently on tx w/ Cymbalta but PHQ 9 today was 14.  'a lot of days I think, what is the use??'.   Review of Systems For ROS see HPI     Objective:   Physical Exam  Constitutional: She is oriented to person, place, and time. She appears well-developed and well-nourished. No distress.  obese  HENT:  Head: Normocephalic and atraumatic.  Eyes: Pupils are equal, round, and reactive to light. Conjunctivae and EOM are normal.  Neck: Normal range of motion. Neck supple. No thyromegaly present.  Cardiovascular: Normal rate, regular rhythm, normal heart sounds and intact distal pulses.   No murmur heard. Pulmonary/Chest: Effort normal and breath sounds normal. No respiratory distress.  Abdominal: Soft. She exhibits no distension. There is no tenderness.  Musculoskeletal: She exhibits no edema.  Lymphadenopathy:    She has no cervical adenopathy.  Neurological: She is alert and oriented to person, place, and time.  Antalgic gait, ambulates w/ cane  Skin: Skin is warm and dry.  Psychiatric: Her behavior is normal. Thought content normal.  Flat affect, depressed mood  Vitals reviewed.         Assessment & Plan:

## 2017-08-06 ENCOUNTER — Encounter: Payer: Self-pay | Admitting: General Practice

## 2017-09-16 ENCOUNTER — Ambulatory Visit: Payer: Medicare Other | Admitting: Family Medicine

## 2017-12-09 ENCOUNTER — Ambulatory Visit (INDEPENDENT_AMBULATORY_CARE_PROVIDER_SITE_OTHER): Payer: Medicare Other | Admitting: Family Medicine

## 2017-12-09 ENCOUNTER — Encounter: Payer: Self-pay | Admitting: Family Medicine

## 2017-12-09 ENCOUNTER — Encounter: Payer: Self-pay | Admitting: General Practice

## 2017-12-09 ENCOUNTER — Other Ambulatory Visit: Payer: Self-pay

## 2017-12-09 VITALS — BP 133/93 | HR 72 | Temp 99.0°F | Resp 17 | Ht 68.0 in | Wt 211.1 lb

## 2017-12-09 DIAGNOSIS — E785 Hyperlipidemia, unspecified: Secondary | ICD-10-CM

## 2017-12-09 DIAGNOSIS — F418 Other specified anxiety disorders: Secondary | ICD-10-CM

## 2017-12-09 DIAGNOSIS — G43809 Other migraine, not intractable, without status migrainosus: Secondary | ICD-10-CM | POA: Diagnosis not present

## 2017-12-09 DIAGNOSIS — G43909 Migraine, unspecified, not intractable, without status migrainosus: Secondary | ICD-10-CM | POA: Insufficient documentation

## 2017-12-09 DIAGNOSIS — E119 Type 2 diabetes mellitus without complications: Secondary | ICD-10-CM

## 2017-12-09 LAB — HEMOGLOBIN A1C: Hgb A1c MFr Bld: 7.1 % — ABNORMAL HIGH (ref 4.6–6.5)

## 2017-12-09 LAB — LIPID PANEL
CHOLESTEROL: 167 mg/dL (ref 0–200)
HDL: 58.4 mg/dL (ref >39.00)
LDL Cholesterol: 85 mg/dL (ref 0–99)
NONHDL: 108.46
Total CHOL/HDL Ratio: 3
Triglycerides: 116 mg/dL (ref 0.0–149.0)
VLDL: 23.2 mg/dL (ref 0.0–40.0)

## 2017-12-09 LAB — CBC WITH DIFFERENTIAL/PLATELET
BASOS PCT: 0.6 % (ref 0.0–3.0)
Basophils Absolute: 0 10*3/uL (ref 0.0–0.1)
EOS ABS: 0.2 10*3/uL (ref 0.0–0.7)
EOS PCT: 2.5 % (ref 0.0–5.0)
HCT: 42.6 % (ref 36.0–46.0)
HEMOGLOBIN: 14.3 g/dL (ref 12.0–15.0)
LYMPHS ABS: 2.5 10*3/uL (ref 0.7–4.0)
Lymphocytes Relative: 39 % (ref 12.0–46.0)
MCHC: 33.5 g/dL (ref 30.0–36.0)
MCV: 88.4 fl (ref 78.0–100.0)
MONO ABS: 0.3 10*3/uL (ref 0.1–1.0)
Monocytes Relative: 4.7 % (ref 3.0–12.0)
NEUTROS ABS: 3.4 10*3/uL (ref 1.4–7.7)
Neutrophils Relative %: 53.2 % (ref 43.0–77.0)
PLATELETS: 278 10*3/uL (ref 150.0–400.0)
RBC: 4.82 Mil/uL (ref 3.87–5.11)
RDW: 13.7 % (ref 11.5–15.5)
WBC: 6.5 10*3/uL (ref 4.0–10.5)

## 2017-12-09 LAB — HEPATIC FUNCTION PANEL
ALK PHOS: 56 U/L (ref 39–117)
ALT: 20 U/L (ref 0–35)
AST: 20 U/L (ref 0–37)
Albumin: 4.4 g/dL (ref 3.5–5.2)
BILIRUBIN DIRECT: 0.1 mg/dL (ref 0.0–0.3)
Total Bilirubin: 0.8 mg/dL (ref 0.2–1.2)
Total Protein: 8.1 g/dL (ref 6.0–8.3)

## 2017-12-09 LAB — BASIC METABOLIC PANEL
BUN: 9 mg/dL (ref 6–23)
CALCIUM: 9.8 mg/dL (ref 8.4–10.5)
CO2: 30 mEq/L (ref 19–32)
Chloride: 102 mEq/L (ref 96–112)
Creatinine, Ser: 0.82 mg/dL (ref 0.40–1.20)
GFR: 93.07 mL/min (ref >60.00)
Glucose, Bld: 122 mg/dL — ABNORMAL HIGH (ref 70–99)
POTASSIUM: 4.3 meq/L (ref 3.5–5.1)
Sodium: 138 mEq/L (ref 135–145)

## 2017-12-09 LAB — TSH: TSH: 1.05 u[IU]/mL (ref 0.35–4.50)

## 2017-12-09 MED ORDER — SUMATRIPTAN SUCCINATE 50 MG PO TABS: 50.0000 mg | ORAL_TABLET | Freq: Once | ORAL | 3 refills | Status: DC

## 2017-12-09 MED ORDER — DULOXETINE HCL 30 MG PO CPEP: 30.0000 mg | ORAL_CAPSULE | Freq: Every day | ORAL | 3 refills | Status: DC

## 2017-12-09 MED ORDER — PROMETHAZINE HCL 25 MG PO TABS
25.0000 mg | ORAL_TABLET | Freq: Three times a day (TID) | ORAL | 0 refills | Status: DC | PRN
Start: 2017-12-09 — End: 2018-03-22

## 2017-12-09 NOTE — Assessment & Plan Note (Signed)
Chronic problem.  UTD on foot exam and microalbumin.  Due for eye exam. Check labs and start tx prn.

## 2017-12-09 NOTE — Assessment & Plan Note (Addendum)
New.  Pt has hx of this previously.  She says this feels similar to her previous migraines.  Offered Toradol and Phenergan injxns but pt declined due to fear of needles.  Prescription given for phenergan and imitrex.  Reviewed supportive care and red flags that should prompt return.  Pt expressed understanding and is in agreement w/ plan.

## 2017-12-09 NOTE — Assessment & Plan Note (Signed)
Chronic problem.  Pt was intolerant to lipitor in the past but tolerating Crestor.  Recheck lipids and adjust meds prn.

## 2017-12-09 NOTE — Assessment & Plan Note (Signed)
Pt stopped her Cymbalta shortly after we increased the dose to 60mg  daily in October.  Given that Cymbalta also treats her pain, will restart at 30mg  which pt was able to tolerate.  Pt expressed understanding and is in agreement w/ plan.

## 2017-12-09 NOTE — Progress Notes (Signed)
   Subjective:    Patient ID: Crystal Buchanan, female    DOB: [birth year], 57 y.o.   MRN: [id number]  HPI Depression- chronic problem.  Increasing the Cymbalta to 60mg  caused severe nausea.  Pt is willing to decrease back to 30mg   HA and nausea- sxs started yesterday evening.  'years ago I used to get migraines'.  HA is frontal and extends to encompass the whole head.  + sensitivity to light and sound.  Some dizziness yesterday but that has improved.  Husband drove her to appt.   Hyperlipidemia- chronic problem.  Started on Crestor at last visit.  Diet controlled DM- due for repeat labs today.  UTD on foot exam.  UTD on microalbumin   Review of Systems For ROS see HPI     Objective:   Physical Exam  Constitutional: She is oriented to person, place, and time. She appears well-developed and well-nourished. She appears distressed (obviously uncomfortable, head down).  HENT:  Head: Normocephalic and atraumatic.  TMs WNL No TTP over sinuses Minimal nasal congestion  Eyes: Conjunctivae and EOM are normal. Pupils are equal, round, and reactive to light.  Neck: Normal range of motion. Neck supple.  Cardiovascular: Normal rate, regular rhythm, normal heart sounds and intact distal pulses.  Pulmonary/Chest: Effort normal and breath sounds normal. No respiratory distress. She has no wheezes. She has no rales.  Lymphadenopathy:    She has no cervical adenopathy.  Neurological: She is alert and oriented to person, place, and time. She has normal reflexes. No cranial nerve deficit. Coordination normal.  Psychiatric: She has a normal mood and affect. Her behavior is normal. Judgment and thought content normal.  Vitals reviewed.         Assessment & Plan:

## 2017-12-09 NOTE — Patient Instructions (Addendum)
Follow up in 3-4 months to recheck sugars We'll notify you of your lab results and make any changes if needed START the Promethazine for nausea Take 2 Excedrin Migraine when you get home for the headache If no improvement in headache after 30 minutes, take the Imitrex to help your headache Drink plenty of fluids REST! Once headache has improved, restart the Cymbalta 30mg  daily Call with any questions or concerns Hang in there!

## 2018-01-07 DIAGNOSIS — Z6833 Body mass index (BMI) 33.0-33.9, adult: Secondary | ICD-10-CM | POA: Diagnosis not present

## 2018-01-07 DIAGNOSIS — Z1231 Encounter for screening mammogram for malignant neoplasm of breast: Secondary | ICD-10-CM | POA: Diagnosis not present

## 2018-01-07 DIAGNOSIS — Z01419 Encounter for gynecological examination (general) (routine) without abnormal findings: Secondary | ICD-10-CM | POA: Diagnosis not present

## 2018-03-12 ENCOUNTER — Other Ambulatory Visit: Payer: Self-pay | Admitting: General Practice

## 2018-03-12 MED ORDER — ROSUVASTATIN CALCIUM 20 MG PO TABS: 20.0000 mg | ORAL_TABLET | Freq: Every day | ORAL | 1 refills | Status: DC

## 2018-03-22 ENCOUNTER — Encounter: Payer: Self-pay | Admitting: Family Medicine

## 2018-03-22 ENCOUNTER — Other Ambulatory Visit: Payer: Self-pay

## 2018-03-22 ENCOUNTER — Ambulatory Visit (INDEPENDENT_AMBULATORY_CARE_PROVIDER_SITE_OTHER): Payer: Medicare Other | Admitting: Family Medicine

## 2018-03-22 VITALS — BP 122/88 | HR 77 | Temp 98.6°F | Resp 16 | Ht 68.0 in | Wt 213.4 lb

## 2018-03-22 DIAGNOSIS — M79641 Pain in right hand: Secondary | ICD-10-CM

## 2018-03-22 DIAGNOSIS — M79642 Pain in left hand: Secondary | ICD-10-CM | POA: Diagnosis not present

## 2018-03-22 DIAGNOSIS — E119 Type 2 diabetes mellitus without complications: Secondary | ICD-10-CM

## 2018-03-22 DIAGNOSIS — D509 Iron deficiency anemia, unspecified: Secondary | ICD-10-CM | POA: Diagnosis not present

## 2018-03-22 LAB — CBC WITH DIFFERENTIAL/PLATELET
BASOS ABS: 0 10*3/uL (ref 0.0–0.1)
Basophils Relative: 0.8 % (ref 0.0–3.0)
Eosinophils Absolute: 0.2 10*3/uL (ref 0.0–0.7)
Eosinophils Relative: 2.9 % (ref 0.0–5.0)
HCT: 41.2 % (ref 36.0–46.0)
Hemoglobin: 13.8 g/dL (ref 12.0–15.0)
LYMPHS ABS: 2.8 10*3/uL (ref 0.7–4.0)
Lymphocytes Relative: 49.2 % — ABNORMAL HIGH (ref 12.0–46.0)
MCHC: 33.6 g/dL (ref 30.0–36.0)
MCV: 89 fl (ref 78.0–100.0)
MONO ABS: 0.3 10*3/uL (ref 0.1–1.0)
MONOS PCT: 5.9 % (ref 3.0–12.0)
NEUTROS ABS: 2.3 10*3/uL (ref 1.4–7.7)
NEUTROS PCT: 41.2 % — AB (ref 43.0–77.0)
PLATELETS: 248 10*3/uL (ref 150.0–400.0)
RBC: 4.63 Mil/uL (ref 3.87–5.11)
RDW: 13.9 % (ref 11.5–15.5)
WBC: 5.6 10*3/uL (ref 4.0–10.5)

## 2018-03-22 LAB — MICROALBUMIN / CREATININE URINE RATIO
CREATININE, U: 264 mg/dL
MICROALB UR: 9.4 mg/dL — AB (ref 0.0–1.9)
Microalb Creat Ratio: 3.6 mg/g (ref 0.0–30.0)

## 2018-03-22 LAB — BASIC METABOLIC PANEL
BUN: 9 mg/dL (ref 6–23)
CALCIUM: 9.4 mg/dL (ref 8.4–10.5)
CO2: 27 meq/L (ref 19–32)
CREATININE: 0.78 mg/dL (ref 0.40–1.20)
Chloride: 104 mEq/L (ref 96–112)
GFR: 98.5 mL/min (ref >60.00)
GLUCOSE: 133 mg/dL — AB (ref 70–99)
Potassium: 4 mEq/L (ref 3.5–5.1)
SODIUM: 139 meq/L (ref 135–145)

## 2018-03-22 LAB — HEMOGLOBIN A1C: Hgb A1c MFr Bld: 7.3 % — ABNORMAL HIGH (ref 4.6–6.5)

## 2018-03-22 LAB — SEDIMENTATION RATE: Sed Rate: 61 mm/hr — ABNORMAL HIGH (ref 0–30)

## 2018-03-22 NOTE — Assessment & Plan Note (Signed)
Ongoing issue for pt.  Foot exam done today.  Microalbumin ordered.  Pt to schedule her eye exam.  Encouraged healthy diet and exercise as able.  Check labs and start meds prn.

## 2018-03-22 NOTE — Assessment & Plan Note (Signed)
Pt has hx of this.  No longer on iron.  Pt is not clear if she is to continue taking this.  Check CBC and restart prn.

## 2018-03-22 NOTE — Patient Instructions (Addendum)
Follow up in 3-4 months to recheck diabetes and cholesterol We'll notify you of your lab results and make any changes if needed I'll let you know about restarting the iron Schedule your eye exam and have them send me the report! Call Dr Lauris Poag office for the Robaxin Ibuprofen or Aleve as needed for pain Call with any questions or concerns Have a great summer!!

## 2018-03-22 NOTE — Progress Notes (Signed)
   Subjective:    Patient ID: Crystal Buchanan, female    DOB: [birth year], 57 y.o.   MRN: [id number]  HPI DM- chronic problem, attempting to control w/ diet.  Due for microalbumin and foot exam.  Overdue for eye exam.  No CP.  Some SOB w/ exertion.  No HAs, visual changes, abd pain, N/V.  Bilateral hand pain- pt reports L thumb has been 'hurting for months and months'.  Also having pain across R PIP joints.  Some hand weakness.  No swelling of hands.  Iron deficiency anemia- pt has stopped her iron supplement.  + fatigue.  No recent hairloss.  Review of Systems For ROS see HPI     Objective:   Physical Exam  Constitutional: She is oriented to person, place, and time. She appears well-developed and well-nourished. No distress.  HENT:  Head: Normocephalic and atraumatic.  Eyes: Pupils are equal, round, and reactive to light. Conjunctivae and EOM are normal.  Neck: Normal range of motion. Neck supple. No thyromegaly present.  Cardiovascular: Normal rate, regular rhythm, normal heart sounds and intact distal pulses.  No murmur heard. Pulmonary/Chest: Effort normal and breath sounds normal. No respiratory distress.  Abdominal: Soft. She exhibits no distension. There is no tenderness.  Musculoskeletal: She exhibits tenderness (TTP over L CMC joint and R PIP joints). She exhibits no edema.  Lymphadenopathy:    She has no cervical adenopathy.  Neurological: She is alert and oriented to person, place, and time.  Skin: Skin is warm and dry.  Psychiatric: She has a normal mood and affect. Her behavior is normal.  Vitals reviewed.         Assessment & Plan:  Bilateral hand pain- new.  Pain over L CMC joint but no deformity or swelling.  Pain over R PIP joints but again no swelling or deformity.  Check labs to assess for autoimmune process.  If no improvement, may need to proceed w/ xrays.  Pt expressed understanding and is in agreement w/ plan.

## 2018-03-23 ENCOUNTER — Other Ambulatory Visit: Payer: Self-pay | Admitting: General Practice

## 2018-03-23 MED ORDER — METFORMIN HCL 500 MG PO TABS: 500.0000 mg | ORAL_TABLET | Freq: Two times a day (BID) | ORAL | 1 refills | Status: DC

## 2018-03-24 LAB — ANA: Anti Nuclear Antibody(ANA): NEGATIVE

## 2018-03-24 LAB — RHEUMATOID FACTOR

## 2018-03-25 ENCOUNTER — Other Ambulatory Visit: Payer: Self-pay | Admitting: Family Medicine

## 2018-03-25 DIAGNOSIS — M79642 Pain in left hand: Principal | ICD-10-CM

## 2018-03-25 DIAGNOSIS — M79641 Pain in right hand: Secondary | ICD-10-CM

## 2018-03-26 ENCOUNTER — Encounter: Payer: Self-pay | Admitting: Radiology

## 2018-03-26 ENCOUNTER — Ambulatory Visit (INDEPENDENT_AMBULATORY_CARE_PROVIDER_SITE_OTHER)
Admission: RE | Admit: 2018-03-26 | Discharge: 2018-03-26 | Disposition: A | Discharge status: Home / Self Care | Payer: Medicare Other | Source: Ambulatory Visit | Attending: Family Medicine | Admitting: Family Medicine

## 2018-03-26 ENCOUNTER — Ambulatory Visit
Admission: RE | Admit: 2018-03-26 | Discharge: 2018-03-26 | Disposition: A | Discharge status: Home / Self Care | Payer: Medicare Other | Source: Ambulatory Visit | Attending: Family Medicine | Admitting: Family Medicine

## 2018-03-26 DIAGNOSIS — M79641 Pain in right hand: Secondary | ICD-10-CM

## 2018-03-26 DIAGNOSIS — M79642 Pain in left hand: Secondary | ICD-10-CM | POA: Diagnosis not present

## 2018-05-19 ENCOUNTER — Ambulatory Visit: Payer: Medicare Other | Admitting: Adult Health

## 2018-06-14 ENCOUNTER — Other Ambulatory Visit: Payer: Self-pay | Admitting: Family Medicine

## 2018-06-14 DIAGNOSIS — K219 Gastro-esophageal reflux disease without esophagitis: Secondary | ICD-10-CM

## 2018-06-17 ENCOUNTER — Ambulatory Visit (INDEPENDENT_AMBULATORY_CARE_PROVIDER_SITE_OTHER): Payer: Medicare Other | Admitting: Adult Health

## 2018-06-17 ENCOUNTER — Encounter: Payer: Self-pay | Admitting: Adult Health

## 2018-06-17 VITALS — BP 133/86 | HR 82 | Ht 68.0 in | Wt 212.4 lb

## 2018-06-17 DIAGNOSIS — Z9989 Dependence on other enabling machines and devices: Secondary | ICD-10-CM

## 2018-06-17 DIAGNOSIS — G4733 Obstructive sleep apnea (adult) (pediatric): Secondary | ICD-10-CM | POA: Diagnosis not present

## 2018-06-17 NOTE — Progress Notes (Addendum)
PATIENT: Crystal Buchanan DOB: [birth year]  REASON FOR VISIT: follow up HISTORY FROM: patient  HISTORY OF PRESENT ILLNESS: Today 06/17/18:  Crystal Buchanan is a 57 year old female with a history of obstructive sleep apnea on CPAP.  She returns today for follow-up.  Patient reports that she has not been using CPAP.  She states that she is in constant pain from her back.  Her pain specialist has advised that she should not take her pain medication after 6 PM.  Therefore she reports that she is in a lot of pain at night making it impossible for her to use the CPAP continuously.  The patient states that she would like to suspend treatment.  We discussed potentially referring her for a dental device.  She returns today for evaluation.  HISTORY 05/18/2017 (Copied From Dr. Guadelupe Sabin note): I could not review her CPAP compliance data as she forgot her SD card. She reports difficulty using CPAP as she does not sleep well at night, she is particularly bothered by back pain. She sees Dr. Brien Few and right foot pain for which she sees a podiatrist. She had injection into her right foot and also a course of meloxicam. May need foot surgery. She also may need to spinal cord stimulator as I understand for her back pain. She takes generic Percocet as needed but was advised by Dr. Brien Few to avoid after 6 PM. She has lost a little bit of weight, compared to 2014 when she had her sleep study she weighed 214 pounds, currently 210 pounds. She is trying to lose weight. She has not been able to exercise very much due to back pain and foot pain.  REVIEW OF SYSTEMS: Out of a complete 14 system review of symptoms, the patient complains only of the following symptoms, and all other reviewed systems are negative.   Epworth sleepiness score 10, restless leg, apnea, frequent waking, joint pain, back pain, muscle cramps, moles, headache, numbness, weakness  ALLERGIES: Allergies  Allergen Reactions  . Atorvastatin Nausea Only   Along with stomachaches  . Lyrica [Pregabalin]     Dizziness,blurred vision,increased appetite,h/a  . Codeine Palpitations  . Gabapentin Rash    Itching and nausa    HOME MEDICATIONS: Outpatient Medications Prior to Visit  Medication Sig Dispense Refill  . metFORMIN (GLUCOPHAGE) 500 MG tablet Take 1 tablet (500 mg total) by mouth 2 (two) times daily with a meal. 180 tablet 1  . methocarbamol (ROBAXIN) 500 MG tablet Take 500 mg by mouth daily.    Marland Kitchen oxyCODONE-acetaminophen (PERCOCET/ROXICET) 5-325 MG per tablet Take 1 tablet by mouth every 6 (six) hours as needed for pain.    . pantoprazole (PROTONIX) 40 MG tablet TAKE 1 TABLET BY MOUTH ONCE DAILY 30 tablet 6  . rosuvastatin (CRESTOR) 20 MG tablet Take 1 tablet (20 mg total) by mouth daily. 90 tablet 1  . SUMAtriptan (IMITREX) 50 MG tablet Take 1 tablet (50 mg total) by mouth once for 1 dose. May repeat in 2 hours if headache persists or recurs. 10 tablet 3  . tiZANidine (ZANAFLEX) 2 MG tablet Take 2 mg by mouth every 6 (six) hours as needed.     No facility-administered medications prior to visit.     PAST MEDICAL HISTORY: Past Medical History:  Diagnosis Date  . Anemia   . Anxiety   . Arthritis   . Chicken pox   . GERD (gastroesophageal reflux disease)   . Hyperlipidemia   . Migraine   . Neuropathy  right foot  . OSA on CPAP   . Seasonal allergies   . Sleep apnea   . Thyroid disease     PAST SURGICAL HISTORY: Past Surgical History:  Procedure Laterality Date  . BREAST SURGERY    . CERVICAL CONIZATION W/BX N/A 12/16/2013   Procedure: resection of cervical mass;  Surgeon: Marylynn Pearson, MD;  Location: [facility];  Service: Gynecology;  Laterality: N/A;  resection of cx mass-MD wants to use cold knife cone instrruments for this case  . KNEE ARTHROSCOPY  1998  . [place] SURGERY  02-26-12  . TUBAL LIGATION      FAMILY HISTORY: Family History  Problem Relation Age of Onset  . Heart disease Mother   . Diabetes Mother    . Hypertension Mother   . Arthritis Father   . Hyperlipidemia Father   . Heart disease Father   . Stroke Father   . Hypertension Father   . Diabetes Father   . Hypertension Sister   . Hypertension Sister   . Colon cancer Neg Hx   . Stomach cancer Neg Hx   . Esophageal cancer Neg Hx   . Rectal cancer Neg Hx   . Liver cancer Neg Hx     SOCIAL HISTORY: Social History   Socioeconomic History  . Marital status: Married    Spouse name: Not on file  . Number of children: 3  . Years of education: 12th  . Highest education level: Not on file  Occupational History  . Occupation: N/A  Social Needs  . Financial resource strain: Not on file  . Food insecurity:    Worry: Not on file    Inability: Not on file  . Transportation needs:    Medical: Not on file    Non-medical: Not on file  Tobacco Use  . Smoking status: Never Smoker  . Smokeless tobacco: Never Used  Substance and Sexual Activity  . Alcohol use: No  . Drug use: No  . Sexual activity: Not on file  Lifestyle  . Physical activity:    Days per week: Not on file    Minutes per session: Not on file  . Stress: Not on file  Relationships  . Social connections:    Talks on phone: Not on file    Gets together: Not on file    Attends religious service: Not on file    Active member of club or organization: Not on file    Attends meetings of clubs or organizations: Not on file    Relationship status: Not on file  . Intimate partner violence:    Fear of current or ex partner: Not on file    Emotionally abused: Not on file    Physically abused: Not on file    Forced sexual activity: Not on file  Other Topics Concern  . Not on file  Social History Narrative  . Not on file      PHYSICAL EXAM  Vitals:   06/17/18 0727  BP: 133/86  Pulse: 82  Weight: 212 lb 6.4 oz (96.3 kg)  Height: 5\' 8"  (1.727 m)   Body mass index is 32.3 kg/m.  Generalized: Well developed, in no acute distress   Neurological examination   Mentation: Alert oriented to time, place, history taking. Follows all commands speech and language fluent Cranial nerve II-XII: Pupils were equal round reactive to light. Extraocular movements were full, visual field were full on confrontational test. Facial sensation and strength were normal. Uvula tongue midline.  Head turning and shoulder shrug  were normal and symmetric. Motor: The motor testing reveals 5 over 5 strength of all 4 extremities. Good symmetric motor tone is noted throughout.  Sensory: Sensory testing is intact to soft touch on all 4 extremities. No evidence of extinction is noted.  Coordination: Cerebellar testing reveals good finger-nose-finger and heel-to-shin bilaterally.  Gait and station: Gait is normal.  Reflexes: Deep tendon reflexes are symmetric and normal bilaterally.   DIAGNOSTIC DATA (LABS, IMAGING, TESTING) - I reviewed patient records, labs, notes, testing and imaging myself where available.  Lab Results  Component Value Date   WBC 5.6 03/22/2018   HGB 13.8 03/22/2018   HCT 41.2 03/22/2018   MCV 89.0 03/22/2018   PLT 248.0 03/22/2018      Component Value Date/Time   NA 139 03/22/2018 1115   K 4.0 03/22/2018 1115   CL 104 03/22/2018 1115   CO2 27 03/22/2018 1115   GLUCOSE 133 (H) 03/22/2018 1115   BUN 9 03/22/2018 1115   CREATININE 0.78 03/22/2018 1115   CALCIUM 9.4 03/22/2018 1115   PROT 8.1 12/09/2017 1109   ALBUMIN 4.4 12/09/2017 1109   AST 20 12/09/2017 1109   ALT 20 12/09/2017 1109   ALKPHOS 56 12/09/2017 1109   BILITOT 0.8 12/09/2017 1109   Lab Results  Component Value Date   CHOL 167 12/09/2017   HDL 58.40 12/09/2017   LDLCALC 85 12/09/2017   LDLDIRECT 213.0 08/04/2016   TRIG 116.0 12/09/2017   CHOLHDL 3 12/09/2017   Lab Results  Component Value Date   HGBA1C 7.3 (H) 03/22/2018    Lab Results  Component Value Date   TSH 1.05 12/09/2017      ASSESSMENT AND PLAN 57 y.o. year old female  has a past medical history of  Anemia, Anxiety, Arthritis, Chicken pox, GERD (gastroesophageal reflux disease), Hyperlipidemia, Migraine, Neuropathy, OSA on CPAP, Seasonal allergies, Sleep apnea, and Thyroid disease. here with:  1.  Obstructive sleep apnea   Due to patient's pain at night she is unable to use the CPAP.  She wants to suspend treatment.  I reviewed potential risks associated with untreated sleep apnea.  She voiced understanding.  Advised that we could refer her for a dental device to treat her apnea.  She reports that she will consider this and call if she wants a referral.  She will follow-up if needed.   I spent 15 minutes with the patient. 50% of this time was spent reviewing risks associated with untreated sleep apnea and other treatment options   Ward Givens, MSN, NP-C 06/17/2018, 7:25 AM [facility] [address], [place], [place] [address] [phone]  I reviewed the above note and documentation by the Nurse Practitioner and agree with the history, physical exam, assessment and plan as outlined above. I was immediately available for face-to-face consultation. Star Age, MD, PhD [facility])

## 2018-06-17 NOTE — Progress Notes (Signed)
Fax confirmation received suspend cpap orders [phone].  sy

## 2018-06-17 NOTE — Patient Instructions (Signed)
Your Plan:  Will suspend CPAP treatment If you want a dental device please call If your symptoms worsen or you develop new symptoms please let us know.   Thank you for coming to see [name] at [facility]. I hope we have been able to provide you high quality care today.  You may receive a patient satisfaction survey over the next few weeks. We would appreciate your feedback and comments so that we may continue to improve ourselves and the health of our patients.

## 2018-07-19 ENCOUNTER — Other Ambulatory Visit: Payer: Self-pay

## 2018-07-19 ENCOUNTER — Ambulatory Visit (INDEPENDENT_AMBULATORY_CARE_PROVIDER_SITE_OTHER): Payer: Medicare Other | Admitting: Family Medicine

## 2018-07-19 ENCOUNTER — Encounter: Payer: Self-pay | Admitting: Family Medicine

## 2018-07-19 VITALS — BP 122/90 | HR 80 | Temp 98.5°F | Resp 16 | Ht 68.0 in | Wt 206.5 lb

## 2018-07-19 DIAGNOSIS — E785 Hyperlipidemia, unspecified: Secondary | ICD-10-CM

## 2018-07-19 DIAGNOSIS — M79641 Pain in right hand: Secondary | ICD-10-CM

## 2018-07-19 DIAGNOSIS — M79642 Pain in left hand: Secondary | ICD-10-CM

## 2018-07-19 DIAGNOSIS — E119 Type 2 diabetes mellitus without complications: Secondary | ICD-10-CM

## 2018-07-19 LAB — CBC WITH DIFFERENTIAL/PLATELET
Basophils Absolute: 0 10*3/uL (ref 0.0–0.1)
Basophils Relative: 0.6 % (ref 0.0–3.0)
EOS ABS: 0.1 10*3/uL (ref 0.0–0.7)
Eosinophils Relative: 2.1 % (ref 0.0–5.0)
HEMATOCRIT: 39.5 % (ref 36.0–46.0)
Hemoglobin: 13.3 g/dL (ref 12.0–15.0)
LYMPHS PCT: 46.1 % — AB (ref 12.0–46.0)
Lymphs Abs: 2.7 10*3/uL (ref 0.7–4.0)
MCHC: 33.6 g/dL (ref 30.0–36.0)
MCV: 87 fl (ref 78.0–100.0)
MONOS PCT: 5.1 % (ref 3.0–12.0)
Monocytes Absolute: 0.3 10*3/uL (ref 0.1–1.0)
NEUTROS ABS: 2.7 10*3/uL (ref 1.4–7.7)
Neutrophils Relative %: 46.1 % (ref 43.0–77.0)
PLATELETS: 261 10*3/uL (ref 150.0–400.0)
RBC: 4.54 Mil/uL (ref 3.87–5.11)
RDW: 14.2 % (ref 11.5–15.5)
WBC: 5.9 10*3/uL (ref 4.0–10.5)

## 2018-07-19 LAB — HEPATIC FUNCTION PANEL
ALBUMIN: 4.4 g/dL (ref 3.5–5.2)
ALK PHOS: 54 U/L (ref 39–117)
ALT: 20 U/L (ref 0–35)
AST: 22 U/L (ref 0–37)
Bilirubin, Direct: 0.1 mg/dL (ref 0.0–0.3)
TOTAL PROTEIN: 7.7 g/dL (ref 6.0–8.3)
Total Bilirubin: 0.8 mg/dL (ref 0.2–1.2)

## 2018-07-19 LAB — LIPID PANEL
CHOLESTEROL: 138 mg/dL (ref 0–200)
HDL: 50 mg/dL (ref >39.00)
LDL CALC: 66 mg/dL (ref 0–99)
NonHDL: 88.28
Total CHOL/HDL Ratio: 3
Triglycerides: 113 mg/dL (ref 0.0–149.0)
VLDL: 22.6 mg/dL (ref 0.0–40.0)

## 2018-07-19 LAB — BASIC METABOLIC PANEL
BUN: 7 mg/dL (ref 6–23)
CO2: 29 mEq/L (ref 19–32)
Calcium: 9.5 mg/dL (ref 8.4–10.5)
Chloride: 104 mEq/L (ref 96–112)
Creatinine, Ser: 0.76 mg/dL (ref 0.40–1.20)
GFR: 101.37 mL/min (ref >60.00)
GLUCOSE: 121 mg/dL — AB (ref 70–99)
POTASSIUM: 4.2 meq/L (ref 3.5–5.1)
SODIUM: 141 meq/L (ref 135–145)

## 2018-07-19 LAB — HEMOGLOBIN A1C: HEMOGLOBIN A1C: 6.9 % — AB (ref 4.6–6.5)

## 2018-07-19 LAB — TSH: TSH: 1.88 u[IU]/mL (ref 0.35–4.50)

## 2018-07-19 MED ORDER — DICLOFENAC SODIUM 1 % TD GEL: 2.0000 g | Freq: Four times a day (QID) | TRANSDERMAL | 3 refills | Status: AC

## 2018-07-19 MED ORDER — METFORMIN HCL 500 MG PO TABS: 500.0000 mg | ORAL_TABLET | Freq: Two times a day (BID) | ORAL | 1 refills | Status: DC

## 2018-07-19 NOTE — Assessment & Plan Note (Signed)
Ongoing issue for pt.  She had negative xrays and negative Rheumatoid labs at last visit.  Start Voltaren gel and monitor for improvement.  If no improvement, will consider hand referral (offered today and pt declined).

## 2018-07-19 NOTE — Assessment & Plan Note (Signed)
Chronic problem.  Tolerating Metformin w/o difficulty.  UTD on foot exam and microalbmin.  Stressed need for pt to schedule eye exam.  Applauded her 6 lb weight loss.  Check labs.  Adjust meds prn

## 2018-07-19 NOTE — Patient Instructions (Signed)
Follow up in 3-4 months to recheck sugar We'll notify you of your lab results and make any changes if needed [place]!!! Continue to work on healthy diet and regular exercise- you're doing great!!! Apply the Voltaren gel up to 4x/day over the hands as needed for pain and inflammation Call with any questions or concerns Happy Fall!!!

## 2018-07-19 NOTE — Progress Notes (Signed)
   Subjective:    Patient ID: Crystal Buchanan, female    DOB: 1961-03-17, 57 y.o.   MRN: [id number]  HPI DM- chronic problem, on Metformin 500mg  BID.  UTD on foot exam, microalbumin.  Due for eye exam.  Pt plans to schedule.  Rare symptomatic lows.  No CP, SOB, HAs, visual changes, abd pain, N/V.  Chronic R foot numbness due to back issues.  Hyperlipidemia- chronic problem, on Crestor 20mg  nightly.  Down 6 lbs.  Bilateral hand pain- pt report pain over L CMC and MCP joints and then over PIP joints on R hand.  Had (-) xrays done.    Review of Systems For ROS see HPI     Objective:   Physical Exam  Constitutional: She is oriented to person, place, and time. She appears well-developed and well-nourished. No distress.  HENT:  Head: Normocephalic and atraumatic.  Eyes: Pupils are equal, round, and reactive to light. Conjunctivae and EOM are normal.  Neck: Normal range of motion. Neck supple. No thyromegaly present.  Cardiovascular: Normal rate, regular rhythm, normal heart sounds and intact distal pulses.  No murmur heard. Pulmonary/Chest: Effort normal and breath sounds normal. No respiratory distress.  Abdominal: Soft. She exhibits no distension. There is no tenderness.  Musculoskeletal: She exhibits no edema (no swelling of hands bilaterally) or deformity (no deformity of CMC or MCP on L, no deformities of PIP joints bilaterally).  Lymphadenopathy:    She has no cervical adenopathy.  Neurological: She is alert and oriented to person, place, and time.  Skin: Skin is warm and dry.  Psychiatric: She has a normal mood and affect. Her behavior is normal.  Vitals reviewed.         Assessment & Plan:

## 2018-07-19 NOTE — Assessment & Plan Note (Signed)
Chronic problem, tolerating statin w/o difficulty.  Check labs.  Adjust meds prn  

## 2018-07-20 ENCOUNTER — Encounter: Payer: Self-pay | Admitting: General Practice

## 2018-09-28 DIAGNOSIS — Z6830 Body mass index (BMI) 30.0-30.9, adult: Secondary | ICD-10-CM | POA: Diagnosis not present

## 2018-09-28 DIAGNOSIS — Z79899 Other long term (current) drug therapy: Secondary | ICD-10-CM | POA: Diagnosis not present

## 2018-09-28 DIAGNOSIS — F411 Generalized anxiety disorder: Secondary | ICD-10-CM | POA: Diagnosis not present

## 2018-09-28 DIAGNOSIS — G4730 Sleep apnea, unspecified: Secondary | ICD-10-CM | POA: Diagnosis not present

## 2018-09-28 DIAGNOSIS — F329 Major depressive disorder, single episode, unspecified: Secondary | ICD-10-CM | POA: Diagnosis not present

## 2018-09-28 DIAGNOSIS — I10 Essential (primary) hypertension: Secondary | ICD-10-CM | POA: Diagnosis not present

## 2018-09-28 DIAGNOSIS — M961 Postlaminectomy syndrome, not elsewhere classified: Secondary | ICD-10-CM | POA: Diagnosis not present

## 2018-10-26 ENCOUNTER — Encounter: Payer: Self-pay | Admitting: Family Medicine

## 2018-10-26 ENCOUNTER — Ambulatory Visit (INDEPENDENT_AMBULATORY_CARE_PROVIDER_SITE_OTHER): Payer: Medicare Other | Admitting: Family Medicine

## 2018-10-26 ENCOUNTER — Other Ambulatory Visit: Payer: Self-pay

## 2018-10-26 VITALS — BP 123/80 | HR 72 | Temp 98.1°F | Resp 16 | Ht 68.0 in | Wt 196.0 lb

## 2018-10-26 DIAGNOSIS — E119 Type 2 diabetes mellitus without complications: Secondary | ICD-10-CM

## 2018-10-26 LAB — HEMOGLOBIN A1C: HEMOGLOBIN A1C: 6.3 % (ref 4.6–6.5)

## 2018-10-26 LAB — BASIC METABOLIC PANEL
BUN: 10 mg/dL (ref 6–23)
CO2: 29 mEq/L (ref 19–32)
CREATININE: 0.88 mg/dL (ref 0.40–1.20)
Calcium: 9.8 mg/dL (ref 8.4–10.5)
Chloride: 103 mEq/L (ref 96–112)
GFR: 85.51 mL/min (ref >60.00)
Glucose, Bld: 103 mg/dL — ABNORMAL HIGH (ref 70–99)
Potassium: 3.8 mEq/L (ref 3.5–5.1)
Sodium: 138 mEq/L (ref 135–145)

## 2018-10-26 MED ORDER — ROSUVASTATIN CALCIUM 20 MG PO TABS: 20.0000 mg | ORAL_TABLET | Freq: Every day | ORAL | 1 refills | Status: DC

## 2018-10-26 NOTE — Patient Instructions (Addendum)
Follow up in 3-4 months to recheck diabetes and cholesterol We'll notify you of your lab results and make any changes if needed Keep up the good work!  You look great! SCHEDULE YOUR EYE EXAM!!! Call with any questions or concerns Happy New Year!!

## 2018-10-26 NOTE — Progress Notes (Signed)
   Subjective:    Patient ID: Crystal Buchanan, female    DOB: [birth year], 58 y.o.   MRN: [id number]  HPI DM- chronic problem.  Hx of good control on Metformin 500mg  BID.  UTD on foot exam, microalbumin.  Due for eye exam.  Declines flu and pneumonia.  She is down 11 lbs since September.  No CP, SOB, HAs, visual changes, abd pain, N/V.  Pt had symptomatic low but was able to recognize and treat it.     Review of Systems For ROS see HPI     Objective:   Physical Exam Vitals signs reviewed.  Constitutional:      General: She is not in acute distress.    Appearance: She is well-developed.  HENT:     Head: Normocephalic and atraumatic.  Eyes:     Conjunctiva/sclera: Conjunctivae normal.     Pupils: Pupils are equal, round, and reactive to light.  Neck:     Musculoskeletal: Normal range of motion and neck supple.     Thyroid: No thyromegaly.  Cardiovascular:     Rate and Rhythm: Normal rate and regular rhythm.     Heart sounds: Normal heart sounds. No murmur.  Pulmonary:     Effort: Pulmonary effort is normal. No respiratory distress.     Breath sounds: Normal breath sounds.  Abdominal:     General: There is no distension.     Palpations: Abdomen is soft.     Tenderness: There is no abdominal tenderness.  Lymphadenopathy:     Cervical: No cervical adenopathy.  Skin:    General: Skin is warm and dry.  Neurological:     Mental Status: She is alert and oriented to person, place, and time.  Psychiatric:        Behavior: Behavior normal.           Assessment & Plan:

## 2018-10-26 NOTE — Assessment & Plan Note (Signed)
Chronic problem.  Tolerating metformin w/o difficulty.  UTD on foot exam, microalbumin.  Overdue for eye exam.  Stressed need for her to schedule.  Check labs.  Adjust meds prn.

## 2018-10-27 ENCOUNTER — Encounter: Payer: Self-pay | Admitting: General Practice

## 2019-02-02 ENCOUNTER — Encounter: Payer: Self-pay | Admitting: Family Medicine

## 2019-02-24 ENCOUNTER — Encounter: Payer: Self-pay | Admitting: Family Medicine

## 2019-02-24 ENCOUNTER — Ambulatory Visit (INDEPENDENT_AMBULATORY_CARE_PROVIDER_SITE_OTHER): Payer: Medicare Other | Admitting: Family Medicine

## 2019-02-24 VITALS — Ht 68.0 in | Wt 198.0 lb

## 2019-02-24 DIAGNOSIS — E1169 Type 2 diabetes mellitus with other specified complication: Secondary | ICD-10-CM | POA: Diagnosis not present

## 2019-02-24 DIAGNOSIS — F419 Anxiety disorder, unspecified: Secondary | ICD-10-CM | POA: Diagnosis not present

## 2019-02-24 DIAGNOSIS — E785 Hyperlipidemia, unspecified: Secondary | ICD-10-CM

## 2019-02-24 DIAGNOSIS — E119 Type 2 diabetes mellitus without complications: Secondary | ICD-10-CM

## 2019-02-24 DIAGNOSIS — L299 Pruritus, unspecified: Secondary | ICD-10-CM | POA: Diagnosis not present

## 2019-02-24 MED ORDER — TRIAMCINOLONE ACETONIDE 0.1 % EX OINT: 1.0000 "application " | TOPICAL_OINTMENT | Freq: Two times a day (BID) | CUTANEOUS | 1 refills | Status: DC

## 2019-02-24 MED ORDER — ALPRAZOLAM 0.5 MG PO TABS: 0.5000 mg | ORAL_TABLET | Freq: Two times a day (BID) | ORAL | 1 refills | Status: DC | PRN

## 2019-02-24 MED ORDER — SERTRALINE HCL 25 MG PO TABS: 25.0000 mg | ORAL_TABLET | Freq: Every day | ORAL | 3 refills | Status: DC

## 2019-02-24 NOTE — Progress Notes (Signed)
Virtual Visit via Video   I connected with patient on 02/24/19 at 10:20 AM EDT by a video enabled telemedicine application and verified that I am speaking with the correct person using two identifiers.  Location patient: Home Location provider: Acupuncturist, Office Persons participating in the virtual visit: Patient, Provider, [place] (Jess B)  I discussed the limitations of evaluation and management by telemedicine and the availability of in person appointments. The patient expressed understanding and agreed to proceed.  Interactive audio and video telecommunications were attempted between this provider and patient, however failed, due to patient having technical difficulties OR patient did not have access to video capability.  We continued and completed visit with audio only.  Subjective:   HPI:  DM- chronic problem, on Metformin 500mg  BID.  Hx of good control.  UTD on urine micro, due for eye exam.  UTD on foot exam.  Denies symptomatic lows.  Has known neuropathy from back surgery.  Denies CP, SOB, HAs, visual changes.  Hyperlipidemia- chronic problem, on Crestor 20mg  daily.  No abd pain, N/V.  Itching- 'i've never had this before'.  Back, arms.  No itching of legs.  No itching of abdomen.  Husband does not have similar itching.  No rash visible.  Pt using lotion daily.  Pt reports feeling 'just so wound up'.  sxs started 'a couple of months ago'.  Pacing while having this conversation.  Not sleeping.  ROS:   See pertinent positives and negatives per HPI.  Patient Active Problem List   Diagnosis Date Noted  . Bilateral hand pain 07/19/2018  . Migraine 12/09/2017  . Diabetes mellitus without complication ([place]) 24/23/5361  . OSA on CPAP 08/04/2016  . Thyromegaly 08/04/2016  . Depression with anxiety 08/04/2016  . GERD (gastroesophageal reflux disease) 10/28/2013  . Hyperlipidemia 02/16/2013  . Routine general medical examination at a health care facility 02/16/2013  .  Chronic radicular lumbar pain 12/01/2012  . Neuropathic pain of foot 12/01/2012  . Anemia, iron deficiency 12/01/2012  . Insomnia 12/01/2012    Social History   Tobacco Use  . Smoking status: Never Smoker  . Smokeless tobacco: Never Used  Substance Use Topics  . Alcohol use: No    Current Outpatient Medications:  .  diclofenac sodium (VOLTAREN) 1 % GEL, Apply 2 g topically 4 (four) times daily., Disp: 100 g, Rfl: 3 .  metFORMIN (GLUCOPHAGE) 500 MG tablet, Take 1 tablet (500 mg total) by mouth 2 (two) times daily with a meal., Disp: 180 tablet, Rfl: 1 .  methocarbamol (ROBAXIN) 500 MG tablet, Take 500 mg by mouth daily., Disp: , Rfl:  .  oxyCODONE-acetaminophen (PERCOCET/ROXICET) 5-325 MG per tablet, Take 1 tablet by mouth every 6 (six) hours as needed for pain., Disp: , Rfl:  .  pantoprazole (PROTONIX) 40 MG tablet, TAKE 1 TABLET BY MOUTH ONCE DAILY, Disp: 30 tablet, Rfl: 6 .  rosuvastatin (CRESTOR) 20 MG tablet, Take 1 tablet (20 mg total) by mouth daily., Disp: 90 tablet, Rfl: 1 .  tiZANidine (ZANAFLEX) 2 MG tablet, Take 2 mg by mouth every 6 (six) hours as needed., Disp: , Rfl:  .  SUMAtriptan (IMITREX) 50 MG tablet, Take 1 tablet (50 mg total) by mouth once for 1 dose. May repeat in 2 hours if headache persists or recurs., Disp: 10 tablet, Rfl: 3  Allergies  Allergen Reactions  . Atorvastatin Nausea Only    Along with stomachaches  . Lyrica [Pregabalin]     Dizziness,blurred vision,increased appetite,h/a  .  Codeine Palpitations  . Gabapentin Rash    Itching and nausa    Objective:   Ht 5\' 8"  (1.727 m)   Wt 198 lb (89.8 kg)   BMI 30.11 kg/m   Pt is able to speak clearly, coherently without shortness of breath or increased work of breathing.  Thought process is linear.  Mood is appropriate.   Assessment and Plan:   DM- chronic problem.  Hx of good control.  Due for eye exam but not able to schedule at this time.  Check labs.  Adjust meds prn   Hyperlipidemia-  chronic problem.  Tolerating statin w/o difficulty.  Check labs.  Adjust meds prn   Itching- new.  Given description of itching without rash that occurs in times of stress I suspect these are stress hives or similar.  Start daily antihistamine.  Start SSRI to control anxiety.  Triamcinolone prn. Pt expressed understanding and is in agreement w/ plan.    Anxiety- new.  Pt reports this has been going on for months but has worsened w/ the COVID situation.  She feels she is unable to relax, has difficulty sleeping, and is developing stress hives as a result.  Start Sertraline daily and add alprazolam prn.  Will follow closely.  Pt expressed understanding and is in agreement w/ plan.   Annye Asa, MD 02/24/2019  Time spent with the patient: 23 minutes, of which >50% was spent in obtaining information about symptoms, reviewing previous labs, evaluations, and treatments, counseling about condition (please see the discussed topics above), and developing a plan to further investigate it; had a number of questions which I addressed.

## 2019-02-24 NOTE — Progress Notes (Signed)
I have discussed the procedure for the virtual visit with the patient who has given consent to proceed with assessment and treatment.   Bradyn Soward L Slayden Mennenga, CMA     

## 2019-02-25 ENCOUNTER — Ambulatory Visit: Payer: Medicare Other | Admitting: Family Medicine

## 2019-03-02 ENCOUNTER — Other Ambulatory Visit (INDEPENDENT_AMBULATORY_CARE_PROVIDER_SITE_OTHER): Payer: Medicare Other

## 2019-03-02 DIAGNOSIS — E785 Hyperlipidemia, unspecified: Secondary | ICD-10-CM

## 2019-03-02 DIAGNOSIS — E1169 Type 2 diabetes mellitus with other specified complication: Secondary | ICD-10-CM

## 2019-03-02 DIAGNOSIS — E119 Type 2 diabetes mellitus without complications: Secondary | ICD-10-CM

## 2019-03-02 LAB — BASIC METABOLIC PANEL
BUN: 9 mg/dL (ref 6–23)
CO2: 29 mEq/L (ref 19–32)
Calcium: 9 mg/dL (ref 8.4–10.5)
Chloride: 104 mEq/L (ref 96–112)
Creatinine, Ser: 0.75 mg/dL (ref 0.40–1.20)
GFR: 96.63 mL/min (ref >60.00)
Glucose, Bld: 107 mg/dL — ABNORMAL HIGH (ref 70–99)
Potassium: 4.2 mEq/L (ref 3.5–5.1)
Sodium: 140 mEq/L (ref 135–145)

## 2019-03-02 LAB — HEPATIC FUNCTION PANEL
ALT: 24 U/L (ref 0–35)
AST: 22 U/L (ref 0–37)
Albumin: 4.2 g/dL (ref 3.5–5.2)
Alkaline Phosphatase: 60 U/L (ref 39–117)
Bilirubin, Direct: 0.1 mg/dL (ref 0.0–0.3)
Total Bilirubin: 0.5 mg/dL (ref 0.2–1.2)
Total Protein: 7.3 g/dL (ref 6.0–8.3)

## 2019-03-02 LAB — LIPID PANEL
Cholesterol: 147 mg/dL (ref 0–200)
HDL: 59.6 mg/dL (ref >39.00)
LDL Cholesterol: 71 mg/dL (ref 0–99)
NonHDL: 87.4
Total CHOL/HDL Ratio: 2
Triglycerides: 80 mg/dL (ref 0.0–149.0)
VLDL: 16 mg/dL (ref 0.0–40.0)

## 2019-03-02 LAB — CBC WITH DIFFERENTIAL/PLATELET
Basophils Absolute: 0.1 10*3/uL (ref 0.0–0.1)
Basophils Relative: 1.2 % (ref 0.0–3.0)
Eosinophils Absolute: 0.2 10*3/uL (ref 0.0–0.7)
Eosinophils Relative: 2.9 % (ref 0.0–5.0)
HCT: 38.2 % (ref 36.0–46.0)
Hemoglobin: 13 g/dL (ref 12.0–15.0)
Lymphocytes Relative: 49.5 % — ABNORMAL HIGH (ref 12.0–46.0)
Lymphs Abs: 3.3 10*3/uL (ref 0.7–4.0)
MCHC: 34 g/dL (ref 30.0–36.0)
MCV: 87.9 fl (ref 78.0–100.0)
Monocytes Absolute: 0.4 10*3/uL (ref 0.1–1.0)
Monocytes Relative: 6 % (ref 3.0–12.0)
Neutro Abs: 2.7 10*3/uL (ref 1.4–7.7)
Neutrophils Relative %: 40.4 % — ABNORMAL LOW (ref 43.0–77.0)
Platelets: 263 10*3/uL (ref 150.0–400.0)
RBC: 4.34 Mil/uL (ref 3.87–5.11)
RDW: 14.6 % (ref 11.5–15.5)
WBC: 6.7 10*3/uL (ref 4.0–10.5)

## 2019-03-02 LAB — TSH: TSH: 3.4 u[IU]/mL (ref 0.35–4.50)

## 2019-03-02 LAB — HEMOGLOBIN A1C: Hgb A1c MFr Bld: 6.6 % — ABNORMAL HIGH (ref 4.6–6.5)

## 2019-05-20 ENCOUNTER — Other Ambulatory Visit: Payer: Self-pay | Admitting: Family Medicine

## 2019-08-31 ENCOUNTER — Other Ambulatory Visit: Payer: Self-pay | Admitting: Family Medicine

## 2019-12-05 ENCOUNTER — Other Ambulatory Visit: Payer: Self-pay | Admitting: Family Medicine

## 2020-01-18 ENCOUNTER — Telehealth: Payer: Self-pay | Admitting: Family Medicine

## 2020-01-18 NOTE — Progress Notes (Signed)
  Chronic Care Management   Note  01/18/2020 Name: Crystal Buchanan MRN: [id number] DOB: April 08, 1961  Crystal Buchanan is a 59 y.o. year old female who is a primary care patient of [name], Aundra Millet, MD. I reached out to [name] by phone today in response to a referral sent by [name] PCP, [name], MD.   [name] was given information about Chronic Care Management services today including:  1. CCM service includes personalized support from designated clinical staff supervised by her physician, including individualized plan of care and coordination with other care providers 2. 24/7 contact phone numbers for assistance for urgent and routine care needs. 3. Service will only be billed when office clinical staff spend 20 minutes or more in a month to coordinate care. 4. Only one practitioner may furnish and bill the service in a calendar month. 5. The patient may stop CCM services at any time (effective at the end of the month) by phone call to the office staff.   Patient agreed to services and verbal consent obtained.   Follow up plan:   Earney Hamburg Upstream Scheduler

## 2020-01-22 NOTE — Progress Notes (Signed)
Chronic Care Management Pharmacy  Name: Crystal Buchanan   MRN: [id number]  DOB: 08/19/1961  Chief Complaint/HPI Crystal Buchanan,  59 y.o. , female presents for their Initial CCM visit with the clinical pharmacist via telephone due to COVID-19 Pandemic.  PCP : [name], MD  Their chronic conditions include: HLD, DM and GERD  Office Vistis: -5/7 (PCP): Stress/hives. Started on antihistamine, SSRI, Triamcinolone prn.   Outpatient Encounter Medications as of 01/23/2020  Medication Sig Note  . ALPRAZolam (XANAX) 0.5 MG tablet Take 1 tablet (0.5 mg total) by mouth 2 (two) times daily as needed for anxiety.   . diclofenac sodium (VOLTAREN) 1 % GEL Apply 2 g topically 4 (four) times daily.   . metFORMIN (GLUCOPHAGE) 500 MG tablet TAKE 1 TABLET BY MOUTH TWICE DAILY WITH A MEAL   . methocarbamol (ROBAXIN) 500 MG tablet Take 500 mg by mouth daily. 08/04/2016: Dr. Brien Few  . rosuvastatin (CRESTOR) 20 MG tablet Take 1 tablet by mouth once daily   . sertraline (ZOLOFT) 25 MG tablet Take 1 tablet (25 mg total) by mouth daily.   Marland Kitchen oxyCODONE-acetaminophen (PERCOCET/ROXICET) 5-325 MG per tablet Take 1 tablet by mouth every 6 (six) hours as needed for pain. 08/04/2016: Dr. Brien Few  . pantoprazole (PROTONIX) 40 MG tablet TAKE 1 TABLET BY MOUTH ONCE DAILY (Patient not taking: Reported on 01/23/2020)   . SUMAtriptan (IMITREX) 50 MG tablet Take 1 tablet (50 mg total) by mouth once for 1 dose. May repeat in 2 hours if headache persists or recurs.   Marland Kitchen tiZANidine (ZANAFLEX) 2 MG tablet Take 2 mg by mouth every 6 (six) hours as needed. 08/04/2016: Dr. Brien Few  . triamcinolone ointment (KENALOG) 0.1 % Apply 1 application topically 2 (two) times daily. (Patient not taking: Reported on 01/23/2020)    No facility-administered encounter medications on file as of 01/23/2020.   Current Diagnosis/Assessment:  Goals Addressed            This Visit's Progress   . PharmD Care Plan       CARE PLAN ENTRY  Current  Barriers:  . Chronic Disease Management support, education, and care coordination needs related to HLD and DMII  Pharmacist Clinical Goal(s):  Marland Kitchen LDL <100 . Alc <7%  Interventions: . Comprehensive medication review performed. . Start Tums 500 mg 1-2 tablets as needed for heartburn. If symptoms of heart burn start occurring more than 1-2 times per month, please let us know so we can reassess medication use.  . Take sertraline 25 mg tab as half tablet (12.5 mg) for two weeks before whole tablet daily. Take with food.  . Start topical Voltaren cream  Patient Self Care Activities:  . Patient verbalizes understanding of plan to medication changes above. Please call me at [phone] with any questions. Marland Kitchen Keep up the good work with the diet and weight loss! Congratulations on getting to 188 lbs!  Initial goal documentation        Hyperlipidemia   Lipid Panel     Component Value Date/Time   CHOL 147 03/02/2019 0924   TRIG 80.0 03/02/2019 0924   HDL 59.60 03/02/2019 0924   CHOLHDL 2 03/02/2019 0924   VLDL 16.0 03/02/2019 0924   LDLCALC 71 03/02/2019 0924   LDLDIRECT 213.0 08/04/2016 1158    The 10-year ASCVD risk score [name], et al., 2013) is: 6.8%   Values used to calculate the score:     Age: 51 years     Sex: Female  Is Non-Hispanic African American: Yes     Diabetic: Yes     Tobacco smoker: No     Systolic Blood Pressure: [id number]     Is BP treated: No     HDL Cholesterol: 59.6 mg/dL     Total Cholesterol: 147 mg/dL   Patient has tried these meds in past:  Patient is currently controlled on the following medications: Crestor 20 mg daily.  Reports losing 12 lbs, described in diabetes A/P. Denies any muscle/abd pain or n/v at this time.    Plan Continue current medications and current diet.   Diabetes   Recent Relevant Labs: Lab Results  Component Value Date/Time   HGBA1C 6.6 (H) 03/02/2019 09:24 AM   HGBA1C 6.3 10/26/2018 09:47 AM   MICROALBUR 9.4 (H)  03/22/2018 11:15 AM   MICROALBUR 6.1 (H) 02/02/2017 09:17 AM   Checking BG: Rarely. Patient has lost nearly 12 lbs since last OV, reported 186 lbs currently. Attributes to cutting out chocolate and drinking more water. A1c 6.6% last OV. Patient is currently controlled on the following medications: metformin 500 mg twice daily with a meal. Denies any side effects at this time, including upset stomach or diarrhea.   We discussed: diet and exercise extensively. Patient agreed to call front desk following our conversation to schedule labs and f/u with Dr. [name].   Plan Continue current medications.   GERD  Patient has failed these meds in past: Protonix. Patient is no longer using Protonix every day. Experiences acid reflux symptoms 1-2 times per month. We discussed: use of Tums as needed moving forward.  Plan Start Tums 500 mg 1-2 tablets as needed for heartburn.   Anxiety / Depression  Started on sertraline 25 mg once daily at last OV following episode of what might have been stress-induced hives. Patient has not taken medication consistently since last OV. After further discussion, she is concerned with medication hurting her stomach. Patient counseled on medication and appropriate use, and to consider halving the tablet for 2 weeks and taking with food before transitioning to whole tablet if concerned with stomach pain. Patient expressed understanding.    Plan Take sertraline 25 mg tab as half tablet (12.5 mg) for two weeks before whole tablet daily.    Pain  Concerning nerve-related pain, patient has tried and demonstrated intolerance to duloxetine, gabapentin, Lyrica. Is managing pain currently. Describes recent recommendation (Dr name unclear) to use TENS therapy or something similar to see if this could help with pain. Not interested in this device or any surgery at this time. Prefers to manage with methocarbamol and mentions having relief from a cream she gets at the pharmacy that  contains gabapentin. Denies any reaction or intolerance to cream.   Patient asked about OTC options for arthritis pain. We discussed Voltaren and patient was counseled on use. Previously recorded on profile but does not recall using.   Plan Start topical Voltaren and continue current medications.  ____________ Visit Information Ms. Pistorius was given information about Chronic Care Management services today including:  1. CCM service includes personalized support from designated clinical staff supervised by her physician, including individualized plan of care and coordination with other care providers 2. 24/7 contact phone numbers for assistance for urgent and routine care needs. 3. Standard insurance, coinsurance, copays and deductibles apply for chronic care management only during months in which we provide at least 20 minutes of these services. Most insurances cover these services at 100%, however patients may be responsible for any  copay, coinsurance and/or deductible if applicable. This service may help you avoid the need for more expensive face-to-face services. 4. Only one practitioner may furnish and bill the service in a calendar month. 5. The patient may stop CCM services at any time (effective at the end of the month) by phone call to the office staff.  Patient agreed to services and verbal consent obtained.   [name], Pharm.D. Clinical Pharmacist [facility] at [facility] [phone]

## 2020-01-22 NOTE — Patient Instructions (Addendum)
Visit Information  Goals Addressed            This Visit's Progress   . PharmD Care Plan       CARE PLAN ENTRY  Current Barriers:  . Chronic Disease Management support, education, and care coordination needs related to HLD and DMII  Pharmacist Clinical Goal(s):  Marland Kitchen LDL <100 . Alc <7%  Interventions: . Comprehensive medication review performed. . Start Tums 500 mg 1-2 tablets as needed for heartburn. If symptoms of heart burn start occurring more than 1-2 times per month, please let us know so we can reassess medication use.  . Take sertraline 25 mg tab as half tablet (12.5 mg) for two weeks before whole tablet daily. Take with food.  . Start topical Voltaren cream  Patient Self Care Activities:  . Patient verbalizes understanding of plan to medication changes above. Please call me at [phone] with any questions. Marland Kitchen Keep up the good work with the diet and weight loss! Congratulations on getting to 188 lbs!  Initial goal documentation        Crystal Buchanan was given information about Chronic Care Management services today including:  1. CCM service includes personalized support from designated clinical staff supervised by her physician, including individualized plan of care and coordination with other care providers 2. 24/7 contact phone numbers for assistance for urgent and routine care needs. 3. Standard insurance, coinsurance, copays and deductibles apply for chronic care management only during months in which we provide at least 20 minutes of these services. Most insurances cover these services at 100%, however patients may be responsible for any copay, coinsurance and/or deductible if applicable. This service may help you avoid the need for more expensive face-to-face services. 4. Only one practitioner may furnish and bill the service in a calendar month. 5. The patient may stop CCM services at any time (effective at the end of the month) by phone call to the office  staff.  Patient agreed to services and verbal consent obtained.   The patient verbalized understanding of instructions provided today and agreed to receive a mailed copy of patient instruction and/or educational materials. Telephone follow up appointment with pharmacy team member scheduled for: 7/1 10 am  [name], [place].D. Clinical Pharmacist [facility] at [facility] [phone]   High Cholesterol  High cholesterol is a condition in which the blood has high levels of a white, waxy, fat-like substance (cholesterol). The human body needs small amounts of cholesterol. The liver makes all the cholesterol that the body needs. Extra (excess) cholesterol comes from the food that we eat. Cholesterol is carried from the liver by the blood through the blood vessels. If you have high cholesterol, deposits (plaques) may build up on the walls of your blood vessels (arteries). Plaques make the arteries narrower and stiffer. Cholesterol plaques increase your risk for heart attack and stroke. Work with your health care provider to keep your cholesterol levels in a healthy range. What increases the risk? This condition is more likely to develop in people who:  Eat foods that are high in animal fat (saturated fat) or cholesterol.  Are overweight.  Are not getting enough exercise.  Have a family history of high cholesterol. What are the signs or symptoms? There are no symptoms of this condition. How is this diagnosed? This condition may be diagnosed from the results of a blood test.  If you are older than age 19, your health care provider may check your cholesterol  every 4-6 years.  You may be checked more often if you already have high cholesterol or other risk factors for heart disease. The blood test for cholesterol measures:  "Bad" cholesterol (LDL cholesterol). This is the main type of cholesterol that causes heart disease. The desired level for LDL is less than  100.  "Good" cholesterol (HDL cholesterol). This type helps to protect against heart disease by cleaning the arteries and carrying the LDL away. The desired level for HDL is 60 or higher.  Triglycerides. These are fats that the body can store or burn for energy. The desired number for triglycerides is lower than 150.  Total cholesterol. This is a measure of the total amount of cholesterol in your blood, including LDL cholesterol, HDL cholesterol, and triglycerides. A healthy number is less than 200. How is this treated? This condition is treated with diet changes, lifestyle changes, and medicines. Diet changes  This may include eating more whole grains, fruits, vegetables, nuts, and fish.  This may also include cutting back on red meat and foods that have a lot of added sugar. Lifestyle changes  Changes may include getting at least 40 minutes of aerobic exercise 3 times a week. Aerobic exercises include walking, biking, and swimming. Aerobic exercise along with a healthy diet can help you maintain a healthy weight.  Changes may also include quitting smoking. Medicines  Medicines are usually given if diet and lifestyle changes have failed to reduce your cholesterol to healthy levels.  Your health care provider may prescribe a statin medicine. Statin medicines have been shown to reduce cholesterol, which can reduce the risk of heart disease. Follow these instructions at home: Eating and drinking If told by your health care provider:  Eat chicken (without skin), fish, veal, shellfish, ground [place] breast, and round or loin cuts of red meat.  Do not eat fried foods or fatty meats, such as hot dogs and salami.  Eat plenty of fruits, such as apples.  Eat plenty of vegetables, such as broccoli, potatoes, and carrots.  Eat beans, peas, and lentils.  Eat grains such as barley, rice, couscous, and bulgur wheat.  Eat pasta without cream sauces.  Use skim or nonfat milk, and eat  low-fat or nonfat yogurt and cheeses.  Do not eat or drink whole milk, cream, ice cream, egg yolks, or hard cheeses.  Do not eat stick margarine or tub margarines that contain trans fats (also called partially hydrogenated oils).  Do not eat saturated tropical oils, such as coconut oil and palm oil.  Do not eat cakes, cookies, crackers, or other baked goods that contain trans fats.  General instructions  Exercise as directed by your health care provider. Increase your activity level with activities such as gardening, walking, and taking the stairs.  Take over-the-counter and prescription medicines only as told by your health care provider.  Do not use any products that contain nicotine or tobacco, such as cigarettes and e-cigarettes. If you need help quitting, ask your health care provider.  Keep all follow-up visits as told by your health care provider. This is important. Contact a health care provider if:  You are struggling to maintain a healthy diet or weight.  You need help to start on an exercise program.  You need help to stop smoking. Get help right away if:  You have chest pain.  You have trouble breathing. This information is not intended to replace advice given to you by your health care provider. Make sure you discuss any  questions you have with your health care provider. Document Revised: 10/09/2017 Document Reviewed: 04/05/2016 Elsevier Patient Education  [place].

## 2020-01-23 ENCOUNTER — Ambulatory Visit: Payer: Medicare Other

## 2020-01-23 DIAGNOSIS — E785 Hyperlipidemia, unspecified: Secondary | ICD-10-CM

## 2020-01-23 DIAGNOSIS — E119 Type 2 diabetes mellitus without complications: Secondary | ICD-10-CM

## 2020-01-24 NOTE — Addendum Note (Signed)
Addended by: Davis Gourd on: 01/24/2020 02:59 PM   Modules accepted: Orders

## 2020-02-01 ENCOUNTER — Other Ambulatory Visit: Payer: Self-pay

## 2020-02-01 ENCOUNTER — Encounter: Payer: Self-pay | Admitting: Family Medicine

## 2020-02-01 ENCOUNTER — Telehealth (INDEPENDENT_AMBULATORY_CARE_PROVIDER_SITE_OTHER): Payer: Medicare Other | Admitting: Family Medicine

## 2020-02-01 VITALS — Ht 67.5 in | Wt 184.0 lb

## 2020-02-01 DIAGNOSIS — M5416 Radiculopathy, lumbar region: Secondary | ICD-10-CM | POA: Diagnosis not present

## 2020-02-01 DIAGNOSIS — G8929 Other chronic pain: Secondary | ICD-10-CM

## 2020-02-01 DIAGNOSIS — E119 Type 2 diabetes mellitus without complications: Secondary | ICD-10-CM | POA: Diagnosis not present

## 2020-02-01 DIAGNOSIS — F418 Other specified anxiety disorders: Secondary | ICD-10-CM | POA: Diagnosis not present

## 2020-02-01 DIAGNOSIS — E785 Hyperlipidemia, unspecified: Secondary | ICD-10-CM | POA: Diagnosis not present

## 2020-02-01 DIAGNOSIS — K219 Gastro-esophageal reflux disease without esophagitis: Secondary | ICD-10-CM

## 2020-02-01 DIAGNOSIS — E1169 Type 2 diabetes mellitus with other specified complication: Secondary | ICD-10-CM

## 2020-02-01 DIAGNOSIS — E663 Overweight: Secondary | ICD-10-CM | POA: Insufficient documentation

## 2020-02-01 MED ORDER — TIZANIDINE HCL 2 MG PO TABS: 2.0000 mg | ORAL_TABLET | Freq: Four times a day (QID) | ORAL | 1 refills | Status: AC | PRN

## 2020-02-01 MED ORDER — SERTRALINE HCL 25 MG PO TABS: 25.0000 mg | ORAL_TABLET | Freq: Every day | ORAL | 3 refills | Status: AC

## 2020-02-01 MED ORDER — ROSUVASTATIN CALCIUM 20 MG PO TABS: 20.0000 mg | ORAL_TABLET | Freq: Every day | ORAL | 0 refills | Status: DC

## 2020-02-01 MED ORDER — SUMATRIPTAN SUCCINATE 50 MG PO TABS: 50.0000 mg | ORAL_TABLET | Freq: Once | ORAL | 3 refills | Status: AC

## 2020-02-01 MED ORDER — ALPRAZOLAM 0.5 MG PO TABS: 0.5000 mg | ORAL_TABLET | Freq: Two times a day (BID) | ORAL | 1 refills | Status: AC | PRN

## 2020-02-01 MED ORDER — PANTOPRAZOLE SODIUM 40 MG PO TBEC: 40.0000 mg | DELAYED_RELEASE_TABLET | Freq: Every day | ORAL | 6 refills | Status: DC

## 2020-02-01 NOTE — Progress Notes (Signed)
Virtual Visit via Video   I connected with Crystal Buchanan on 02/01/20 at 11:00 AM EDT by a video enabled telemedicine application and verified that I am speaking with the correct person using two identifiers.  Location Crystal Buchanan: Home Location provider: Acupuncturist, Office Persons participating in the virtual visit: Crystal Buchanan, Provider, Lyle (Jess B)  I discussed the limitations of evaluation and management by telemedicine and the availability of in person appointments. The Crystal Buchanan expressed understanding and agreed to proceed.  Interactive audio and video telecommunications were attempted between this provider and Crystal Buchanan, however failed, due to Crystal Buchanan having technical difficulties OR Crystal Buchanan did not have access to video capability.  We continued and completed visit with audio only.   Subjective:   HPI:   DM- chronic problem, on Metformin 500mg  BID.  Overdue for eye exam, foot exam, microalbumin, A1C.  No CP, SOB, HAs, visual changes.  Rare symptomatic lows.  + numbness of feet but unchanged from previous.  Hyperlipidemia- chronic problem, on Crestor 20mg  daily.  No abd pain, N/V.  Overweight- ongoing issue but pt is down 14 lbs!  BMI is 28.39.  Pt has cut back on sweets and is now walking regularly.  Depression- chronic problem.  Pt had stopped her Sertraline when she ran out.  Having recurrent sxs.  GERD- pt had stopped Protonix.  Is again having some substernal and throat discomfort.  Would like to restart.  Chronic back pain- following w/ Dr Brien Few who wanted to do stimulator.  This worries pt so she has not been back.  Has not seen a neurosurgeon.  ROS:   See pertinent positives and negatives per HPI.  Crystal Buchanan Active Problem List   Diagnosis Date Noted  . Hyperlipidemia associated with type 2 diabetes mellitus ([place]) 02/24/2019  . Anxiety 02/24/2019  . Bilateral hand pain 07/19/2018  . Migraine 12/09/2017  . Diabetes mellitus without complication ([place]) [id number]  . OSA  on CPAP 08/04/2016  . Thyromegaly 08/04/2016  . Depression with anxiety 08/04/2016  . GERD (gastroesophageal reflux disease) 10/28/2013  . Hyperlipidemia 02/16/2013  . Routine general medical examination at a health care facility 02/16/2013  . Chronic radicular lumbar pain 12/01/2012  . Neuropathic pain of foot 12/01/2012  . Anemia, iron deficiency 12/01/2012  . Insomnia 12/01/2012    Social History   Tobacco Use  . Smoking status: Never Smoker  . Smokeless tobacco: Never Used  Substance Use Topics  . Alcohol use: No    Current Outpatient Medications:  .  ALPRAZolam (XANAX) 0.5 MG tablet, Take 1 tablet (0.5 mg total) by mouth 2 (two) times daily as needed for anxiety., Disp: 30 tablet, Rfl: 1 .  diclofenac sodium (VOLTAREN) 1 % GEL, Apply 2 g topically 4 (four) times daily., Disp: 100 g, Rfl: 3 .  metFORMIN (GLUCOPHAGE) 500 MG tablet, TAKE 1 TABLET BY MOUTH TWICE DAILY WITH A MEAL, Disp: 180 tablet, Rfl: 0 .  methocarbamol (ROBAXIN) 500 MG tablet, Take 500 mg by mouth daily., Disp: , Rfl:  .  rosuvastatin (CRESTOR) 20 MG tablet, Take 1 tablet by mouth once daily, Disp: 90 tablet, Rfl: 0 .  SUMAtriptan (IMITREX) 50 MG tablet, Take 1 tablet (50 mg total) by mouth once for 1 dose. May repeat in 2 hours if headache persists or recurs., Disp: 10 tablet, Rfl: 3 .  pantoprazole (PROTONIX) 40 MG tablet, TAKE 1 TABLET BY MOUTH ONCE DAILY (Crystal Buchanan not taking: Reported on 01/23/2020), Disp: 30 tablet, Rfl: 6 .  sertraline (ZOLOFT) 25 MG tablet, Take  1 tablet (25 mg total) by mouth daily. (Crystal Buchanan not taking: Reported on 02/01/2020), Disp: 30 tablet, Rfl: 3 .  tiZANidine (ZANAFLEX) 2 MG tablet, Take 2 mg by mouth every 6 (six) hours as needed., Disp: , Rfl:   Allergies  Allergen Reactions  . Atorvastatin Nausea Only    Along with stomachaches  . Lyrica [Pregabalin]     Dizziness,blurred vision,increased appetite,h/a  . Codeine Palpitations  . Gabapentin Rash    Itching and nausa     Objective:   Ht 5' 7.5" (1.715 m)   Wt 184 lb (83.5 kg)   BMI 28.39 kg/m  Pt is able to speak clearly, coherently without shortness of breath or increased work of breathing.  Thought process is linear.  Mood is appropriate.   Assessment and Plan:   DM- chronic problem, tolerating Metformin w/o difficulty.  Overdue for eye exam, foot exam, microalbumin.  Unable to do foot exam virtually.  Pt to schedule eye exam.  Check labs.  Adjust meds prn   Hyperlipidemia- chronic problem.  Tolerating statin w/o difficulty.  Check labs.  Adjust meds prn   Depression- deteriorated.  Pt stopped meds when her prescription ran out but clearly needs to restart based on PHQ score.  Pt is willing to restart meds and we will monitor for improvement.  GERD- ongoing issue.  sxs are now intermittent.  Pt can take PPI PRN.  Refill provided  Overweight- pt is down 14 lbs since last visit.  Applauded her efforts at healthy diet and regular exercise.  Will follow.  Chronic back pain- ongoing issue for pt.  This is the limiting issue for her.  She has a pain management doctor but has not seen Neurosurg.  Will refer for complete evaluation.   Annye Asa, MD 02/01/2020   Time spent with the Crystal Buchanan: 19 minutes, of which >50% was spent in obtaining information about symptoms, reviewing previous labs, evaluations, and treatments, counseling about condition (please see the discussed topics above), and developing a plan to further investigate it; had a number of questions which I addressed.

## 2020-02-01 NOTE — Progress Notes (Signed)
I have discussed the procedure for the virtual visit with the patient who has given consent to proceed with assessment and treatment.   Jessica L Brodmerkel, CMA     

## 2020-02-02 ENCOUNTER — Other Ambulatory Visit (INDEPENDENT_AMBULATORY_CARE_PROVIDER_SITE_OTHER): Payer: Medicare Other

## 2020-02-02 DIAGNOSIS — E119 Type 2 diabetes mellitus without complications: Secondary | ICD-10-CM

## 2020-02-02 DIAGNOSIS — E1169 Type 2 diabetes mellitus with other specified complication: Secondary | ICD-10-CM

## 2020-02-02 DIAGNOSIS — E785 Hyperlipidemia, unspecified: Secondary | ICD-10-CM

## 2020-02-02 LAB — HEPATIC FUNCTION PANEL
ALT: 21 U/L (ref 0–35)
AST: 22 U/L (ref 0–37)
Albumin: 4.5 g/dL (ref 3.5–5.2)
Alkaline Phosphatase: 49 U/L (ref 39–117)
Bilirubin, Direct: 0.2 mg/dL (ref 0.0–0.3)
Total Bilirubin: 0.7 mg/dL (ref 0.2–1.2)
Total Protein: 7.6 g/dL (ref 6.0–8.3)

## 2020-02-02 LAB — BASIC METABOLIC PANEL
BUN: 10 mg/dL (ref 6–23)
CO2: 30 mEq/L (ref 19–32)
Calcium: 9.5 mg/dL (ref 8.4–10.5)
Chloride: 102 mEq/L (ref 96–112)
Creatinine, Ser: 0.91 mg/dL (ref 0.40–1.20)
GFR: 77.05 mL/min (ref >60.00)
Glucose, Bld: 107 mg/dL — ABNORMAL HIGH (ref 70–99)
Potassium: 3.7 mEq/L (ref 3.5–5.1)
Sodium: 140 mEq/L (ref 135–145)

## 2020-02-02 LAB — CBC WITH DIFFERENTIAL/PLATELET
Basophils Absolute: 0.1 10*3/uL (ref 0.0–0.1)
Basophils Relative: 1 % (ref 0.0–3.0)
Eosinophils Absolute: 0.1 10*3/uL (ref 0.0–0.7)
Eosinophils Relative: 1.7 % (ref 0.0–5.0)
HCT: 39.2 % (ref 36.0–46.0)
Hemoglobin: 13.1 g/dL (ref 12.0–15.0)
Lymphocytes Relative: 49 % — ABNORMAL HIGH (ref 12.0–46.0)
Lymphs Abs: 2.8 10*3/uL (ref 0.7–4.0)
MCHC: 33.4 g/dL (ref 30.0–36.0)
MCV: 87.3 fl (ref 78.0–100.0)
Monocytes Absolute: 0.3 10*3/uL (ref 0.1–1.0)
Monocytes Relative: 5.5 % (ref 3.0–12.0)
Neutro Abs: 2.4 10*3/uL (ref 1.4–7.7)
Neutrophils Relative %: 42.8 % — ABNORMAL LOW (ref 43.0–77.0)
Platelets: 237 10*3/uL (ref 150.0–400.0)
RBC: 4.49 Mil/uL (ref 3.87–5.11)
RDW: 14.5 % (ref 11.5–15.5)
WBC: 5.6 10*3/uL (ref 4.0–10.5)

## 2020-02-02 LAB — LIPID PANEL
Cholesterol: 130 mg/dL (ref 0–200)
HDL: 53.4 mg/dL (ref >39.00)
LDL Cholesterol: 61 mg/dL (ref 0–99)
NonHDL: 76.79
Total CHOL/HDL Ratio: 2
Triglycerides: 77 mg/dL (ref 0.0–149.0)
VLDL: 15.4 mg/dL (ref 0.0–40.0)

## 2020-02-02 LAB — TSH: TSH: 1.84 u[IU]/mL (ref 0.35–4.50)

## 2020-02-02 LAB — HEMOGLOBIN A1C: Hgb A1c MFr Bld: 6.4 % (ref 4.6–6.5)

## 2020-02-02 LAB — MICROALBUMIN / CREATININE URINE RATIO
Creatinine,U: 121.3 mg/dL
Microalb Creat Ratio: 14 mg/g (ref 0.0–30.0)
Microalb, Ur: 17 mg/dL — ABNORMAL HIGH (ref 0.0–1.9)

## 2020-02-03 ENCOUNTER — Encounter: Payer: Self-pay | Admitting: General Practice

## 2020-02-06 DIAGNOSIS — M1990 Unspecified osteoarthritis, unspecified site: Secondary | ICD-10-CM | POA: Insufficient documentation

## 2020-02-06 DIAGNOSIS — G629 Polyneuropathy, unspecified: Secondary | ICD-10-CM | POA: Insufficient documentation

## 2020-02-06 DIAGNOSIS — M5430 Sciatica, unspecified side: Secondary | ICD-10-CM | POA: Insufficient documentation

## 2020-02-06 DIAGNOSIS — Z6829 Body mass index (BMI) 29.0-29.9, adult: Secondary | ICD-10-CM | POA: Diagnosis not present

## 2020-02-06 DIAGNOSIS — Z124 Encounter for screening for malignant neoplasm of cervix: Secondary | ICD-10-CM | POA: Diagnosis not present

## 2020-02-06 DIAGNOSIS — Z1231 Encounter for screening mammogram for malignant neoplasm of breast: Secondary | ICD-10-CM | POA: Diagnosis not present

## 2020-02-08 ENCOUNTER — Other Ambulatory Visit: Payer: Self-pay | Admitting: Obstetrics and Gynecology

## 2020-02-08 DIAGNOSIS — R928 Other abnormal and inconclusive findings on diagnostic imaging of breast: Secondary | ICD-10-CM

## 2020-02-17 ENCOUNTER — Telehealth: Payer: Self-pay | Admitting: Family Medicine

## 2020-02-17 NOTE — Telephone Encounter (Signed)
Pt called in stating that on 02/06/20 she had her mammo and pap done at [facility] and on 02/14/20 she had her eye exam done at [facility].

## 2020-02-17 NOTE — Telephone Encounter (Signed)
Noted and chart updated

## 2020-02-21 ENCOUNTER — Other Ambulatory Visit: Payer: Self-pay | Admitting: Obstetrics and Gynecology

## 2020-02-21 ENCOUNTER — Other Ambulatory Visit: Payer: Self-pay

## 2020-02-21 ENCOUNTER — Ambulatory Visit
Admission: RE | Admit: 2020-02-21 | Discharge: 2020-02-21 | Disposition: A | Discharge status: Home / Self Care | Payer: Medicare Other | Source: Ambulatory Visit | Attending: Obstetrics and Gynecology | Admitting: Obstetrics and Gynecology

## 2020-02-21 DIAGNOSIS — R928 Other abnormal and inconclusive findings on diagnostic imaging of breast: Secondary | ICD-10-CM

## 2020-02-21 DIAGNOSIS — N6489 Other specified disorders of breast: Secondary | ICD-10-CM | POA: Diagnosis not present

## 2020-02-21 DIAGNOSIS — N6320 Unspecified lump in the left breast, unspecified quadrant: Secondary | ICD-10-CM

## 2020-02-27 ENCOUNTER — Ambulatory Visit
Admission: RE | Admit: 2020-02-27 | Discharge: 2020-02-27 | Disposition: A | Discharge status: Home / Self Care | Payer: Medicare Other | Source: Ambulatory Visit | Attending: Obstetrics and Gynecology | Admitting: Obstetrics and Gynecology

## 2020-02-27 ENCOUNTER — Other Ambulatory Visit: Payer: Self-pay

## 2020-02-27 DIAGNOSIS — D242 Benign neoplasm of left breast: Secondary | ICD-10-CM | POA: Diagnosis not present

## 2020-02-27 DIAGNOSIS — R921 Mammographic calcification found on diagnostic imaging of breast: Secondary | ICD-10-CM | POA: Diagnosis not present

## 2020-02-27 DIAGNOSIS — N6320 Unspecified lump in the left breast, unspecified quadrant: Secondary | ICD-10-CM

## 2020-02-27 DIAGNOSIS — R928 Other abnormal and inconclusive findings on diagnostic imaging of breast: Secondary | ICD-10-CM

## 2020-03-08 ENCOUNTER — Telehealth: Payer: Self-pay | Admitting: Family Medicine

## 2020-03-08 MED ORDER — METFORMIN HCL 500 MG PO TABS: ORAL_TABLET | ORAL | 0 refills | Status: DC

## 2020-03-08 NOTE — Telephone Encounter (Signed)
Pt called in asking for a refill on the Metformin to be sent to [facility] on [address], please advise

## 2020-03-08 NOTE — Telephone Encounter (Signed)
Medication filled to pharmacy as requested.   

## 2020-03-09 ENCOUNTER — Ambulatory Visit: Payer: Self-pay | Admitting: Surgery

## 2020-03-09 DIAGNOSIS — D242 Benign neoplasm of left breast: Secondary | ICD-10-CM | POA: Diagnosis not present

## 2020-03-09 NOTE — H&P (Signed)
History of Present Illness Crystal Buchanan. Sully Manzi MD; 03/09/2020 10:00 AM) The patient is a 59 year old female who presents with a breast mass. PCP - Annye Asa, MD Referred by Marylynn Pearson MD for left breast mass  This is a 60 year old female with a history of a previous right lumpectomy in 2012 for benign disease who presents after recent screening mammogram. The mammogram showed some calcifications in the left breast. Further workup showed a mass edging 2.0 x 0.6 x 0.5 cm containing some calcifications. Ultrasound confirmed a 1.4 x 0.6 x 0.5 cm mass at 3:00 3 cm from the nipple posterior depth. The axilla was negative. She underwent biopsy on 02/27/20. Pathology showed a partially hyalinized intraductal papilloma with calcifications. She presents now to discuss excision.  No family history of breast cancer.   Problem List/Past Medical Rodman Key K. Shandon Matson, MD; 03/09/2020 10:00 AM) [name] PAPILLOMA OF BREAST, LEFT (D24.2)  Past Surgical History Emeline Gins, [place]; 03/09/2020 8:43 AM) Breast Biopsy Bilateral. Colon Polyp Removal - Colonoscopy Knee Surgery Left.  Diagnostic Studies History Emeline Gins, [place]; 03/09/2020 8:43 AM) Colonoscopy 1-5 years ago Mammogram within last year  Allergies Emeline Gins, CMA; 03/09/2020 8:45 AM) Codeine Phosphate *ANALGESICS - OPIOID* Gabapentin *CHEMICALS* Lipitor *ANTIHYPERLIPIDEMICS* Lyrica *ANTICONVULSANTS* Allergies Reconciled  Medication History Emeline Gins, CMA; 03/09/2020 8:45 AM) ALPRAZolam (0.5MG  Tablet, Oral) Active. Pantoprazole Sodium (40MG  Tablet DR, Oral) Active. Rosuvastatin Calcium (20MG  Tablet, Oral) Active. Sertraline HCl (25MG  Tablet, Oral) Active. tiZANidine HCl (2MG  Tablet, Oral) Active. metFORMIN HCl (500MG  Tablet, Oral) Active. Methocarbamol (500MG  Tablet, Oral) Active. SUMAtriptan Succinate (50MG  Tablet, Oral) Active. Medications Reconciled  Social History Emeline Gins, [place];  03/09/2020 8:43 AM) Alcohol use Remotely quit alcohol use. Caffeine use Coffee. No drug use Tobacco use Never smoker.  Family History Emeline Gins, [place]; 03/09/2020 8:43 AM) Alcohol Abuse Sister. Arthritis Father, Mother, Sister. Diabetes Mellitus Father, Mother. Heart Disease Father, Mother. Hypertension Father, Mother. Thyroid problems Son.  Pregnancy / Birth History Emeline Gins, [place]; 03/09/2020 8:43 AM) Age at menarche 61 years. Gravida 3 Maternal age 11-25 Para 3  Other Problems Crystal Buchanan. Kindred Reidinger, MD; 03/09/2020 10:00 AM) Anxiety Disorder Arthritis Back Pain Diabetes Mellitus Gastroesophageal Reflux Disease Hypercholesterolemia Lump In Breast     Review of Systems Emeline Gins CMA; 03/09/2020 8:43 AM) General Present- Weight Loss. Not Present- Appetite Loss, Chills, Fatigue, Fever, Night Sweats and Weight Gain. Skin Not Present- Change in Wart/Mole, Dryness, Hives, Jaundice, New Lesions, Non-Healing Wounds, Rash and Ulcer. HEENT Present- Seasonal Allergies and Wears glasses/contact lenses. Not Present- Earache, Hearing Loss, Hoarseness, Nose Bleed, Oral Ulcers, Ringing in the Ears, Sinus Pain, Sore Throat, Visual Disturbances and Yellow Eyes. Breast Present- Breast Mass. Not Present- Breast Pain, Nipple Discharge and Skin Changes. Gastrointestinal Not Present- Abdominal Pain, Bloating, Bloody Stool, Change in Bowel Habits, Chronic diarrhea, Constipation, Difficulty Swallowing, Excessive gas, Gets full quickly at meals, Hemorrhoids, Indigestion, Nausea, Rectal Pain and Vomiting. Musculoskeletal Present- Back Pain and Swelling of Extremities. Not Present- Joint Pain, Joint Stiffness, Muscle Pain and Muscle Weakness. Neurological Present- Headaches, Numbness, Tingling and Weakness. Not Present- Decreased Memory, Fainting, Seizures, Tremor and Trouble walking. Endocrine Present- New Diabetes. Not Present- Cold Intolerance, Excessive Hunger, Hair  Changes, Heat Intolerance and Hot flashes.  Vitals Emeline Gins CMA; 03/09/2020 8:44 AM) 03/09/2020 8:44 AM Weight: 186 lb Height: 68in Body Surface Area: 1.98 m Body Mass Index: 28.28 kg/m  Temp.: 97.103F  Pulse: 109 (Regular)  BP: 118/78(Sitting, Left Arm, Standard)        Physical Exam Rodman Key  Oren Section MD; 03/09/2020 10:00 AM)  The physical exam findings are as follows: Note:Constitutional: WDWN in NAD, conversant, no obvious deformities; resting comfortably Eyes: Pupils equal, round; sclera anicteric; moist conjunctiva; no lid lag HENT: Oral mucosa moist; good dentition Neck: No masses palpated, trachea midline; no thyromegaly Lungs: CTA bilaterally; normal respiratory effort Breasts: Symmetric, no nipple changes or discharge. No axillary or supraclavicular lymphadenopathy. Bilateral fibrocystic changes. No dominant masses. Healed surgical scar in the upper right breast. CV: Regular rate and rhythm; no murmurs; extremities well-perfused with no edema Abd: +bowel sounds, soft, non-tender, no palpable organomegaly; no palpable hernias Musc: Normal gait; no apparent clubbing or cyanosis in extremities Lymphatic: No palpable cervical or axillary lymphadenopathy Skin: Warm, dry; no sign of jaundice Psychiatric - alert and oriented x 4; calm mood and affect    Assessment & Plan Rodman Key K. 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 The surgical procedure has been discussed with the patient. Potential risks, benefits, alternative treatments, and expected outcomes have been explained. All of the patient's questions at this time have been answered. The likelihood of reaching the patient's treatment goal is good. The patient understand the proposed surgical procedure and wishes to proceed.  Crystal Buchanan. [name], MD, [facility] Surgery   General/ Trauma Surgery   03/09/2020 10:01 AM

## 2020-03-15 ENCOUNTER — Other Ambulatory Visit: Payer: Self-pay | Admitting: Surgery

## 2020-03-15 DIAGNOSIS — D242 Benign neoplasm of left breast: Secondary | ICD-10-CM

## 2020-04-02 NOTE — Progress Notes (Addendum)
Subjective:   Crystal Buchanan is a 59 y.o. female who presents for an Initial Medicare Annual Wellness Visit.  I connected with Crystal Buchanan today by telephone and verified that I am speaking with the correct person using two identifiers. Location patient: home Location provider: work Persons participating in the virtual visit: patient, Marine scientist.    I discussed the limitations, risks, security and privacy concerns of performing an evaluation and management service by telephone and the availability of in person appointments. I also discussed with the patient that there may be a patient responsible charge related to this service. The patient expressed understanding and verbally consented to this telephonic visit.    Interactive audio and video telecommunications were attempted between this provider and patient, however failed, due to patient having technical difficulties OR patient did not have access to video capability.  We continued and completed visit with audio only.  Some vital signs may be absent or patient reported.   Time Spent with patient on telephone encounter: 35 minutes  Review of Systems      Cardiac Risk Factors include: diabetes mellitus;dyslipidemia     Objective:    Today's Vitals   04/03/20 1456 04/03/20 1457  Weight: 184 lb (83.5 kg)   Height: 5\' 7"  (1.702 m)   PainSc:  6    Body mass index is 28.82 kg/m.  Advanced Directives 04/03/2020 08/05/2017 12/15/2016 12/12/2013  Does Patient Have a Medical Advance Directive? No No No Patient does not have advance directive  Would patient like information on creating a medical advance directive? Yes (Inpatient - patient defers creating a medical advance directive at this time - Information given) Yes (MAU/Ambulatory/Procedural Areas - Information given) - -    Current Medications (verified) Outpatient Encounter Medications as of 04/03/2020  Medication Sig  . ALPRAZolam (XANAX) 0.5 MG tablet Take 1 tablet (0.5 mg total) by  mouth 2 (two) times daily as needed for anxiety.  . diclofenac sodium (VOLTAREN) 1 % GEL Apply 2 g topically 4 (four) times daily.  . metFORMIN (GLUCOPHAGE) 500 MG tablet TAKE 1 TABLET BY MOUTH TWICE DAILY WITH A MEAL  . methocarbamol (ROBAXIN) 500 MG tablet Take 500 mg by mouth daily.  . Multiple Vitamin (MULTIVITAMIN) LIQD Take 5 mLs by mouth daily.  . pantoprazole (PROTONIX) 40 MG tablet Take 1 tablet (40 mg total) by mouth daily.  . rosuvastatin (CRESTOR) 20 MG tablet Take 1 tablet (20 mg total) by mouth daily.  . sertraline (ZOLOFT) 25 MG tablet Take 1 tablet (25 mg total) by mouth daily.  . SUMAtriptan (IMITREX) 50 MG tablet sumatriptan 50 mg tablet  TAKE 1 TABLT BY MOUTH ONCE FOR 1 DOSE MAY REPEAT IN 2 HOURS IF HEADACHE PERSISTS OR RECURS  . tiZANidine (ZANAFLEX) 2 MG tablet Take 1 tablet (2 mg total) by mouth every 6 (six) hours as needed.  . SUMAtriptan (IMITREX) 50 MG tablet Take 1 tablet (50 mg total) by mouth once for 1 dose. May repeat in 2 hours if headache persists or recurs.   No facility-administered encounter medications on file as of 04/03/2020.    Allergies (verified) Atorvastatin, Lyrica [pregabalin], Codeine, and Gabapentin   History: Past Medical History:  Diagnosis Date  . Anemia   . Anxiety   . Arthritis   . Chicken pox   . GERD (gastroesophageal reflux disease)   . Hyperlipidemia   . Migraine   . Neuropathy    right foot  . OSA on CPAP   . Seasonal allergies   .  Sleep apnea   . Thyroid disease    Past Surgical History:  Procedure Laterality Date  . BREAST EXCISIONAL BIOPSY Right   . BREAST SURGERY    . CERVICAL CONIZATION W/BX N/A 12/16/2013   Procedure: resection of cervical mass;  Surgeon: Marylynn Pearson, MD;  Location: [facility];  Service: Gynecology;  Laterality: N/A;  resection of cx mass-MD wants to use cold knife [facility] instrruments for this case  . colon polyps  12/2016   2 removed  . KNEE ARTHROSCOPY  1998  . [place] SURGERY  02-26-12  .  TUBAL LIGATION     Family History  Problem Relation Age of Onset  . Heart disease Mother   . Diabetes Mother   . Hypertension Mother   . Arthritis Father   . Hyperlipidemia Father   . Heart disease Father   . Stroke Father   . Hypertension Father   . Diabetes Father   . Hypertension Sister   . Hypertension Sister   . Colon cancer Neg Hx   . Stomach cancer Neg Hx   . Esophageal cancer Neg Hx   . Rectal cancer Neg Hx   . Liver cancer Neg Hx    Social History   Socioeconomic History  . Marital status: Married    Spouse name: Not on file  . Number of children: 3  . Years of education: 12th  . Highest education level: Not on file  Occupational History  . Occupation: N/A  Tobacco Use  . Smoking status: Never Smoker  . Smokeless tobacco: Never Used  Vaping Use  . Vaping Use: Never used  Substance and Sexual Activity  . Alcohol use: No  . Drug use: No  . Sexual activity: Not on file  Other Topics Concern  . Not on file  Social History Narrative  . Not on file   Social Determinants of Health   Financial Resource Strain: Low Risk   . Difficulty of Paying Living Expenses: Not hard at all  Food Insecurity: No Food Insecurity  . Worried About Charity fundraiser in the Last Year: Never true  . Ran Out of Food in the Last Year: Never true  Transportation Needs: Unmet Transportation Needs  . Lack of Transportation (Medical): Yes  . Lack of Transportation (Non-Medical): No  Physical Activity: Inactive  . Days of Exercise per Week: 0 days  . Minutes of Exercise per Session: 0 min  Stress: Stress Concern Present  . Feeling of Stress : Rather much  Social Connections: Moderately Integrated  . Frequency of Communication with Friends and Family: More than three times a week  . Frequency of Social Gatherings with Friends and Family: Never  . Attends Religious Services: Never  . Active Member of Clubs or Organizations: Yes  . Attends Archivist Meetings: Never  .  Marital Status: Married    Tobacco Counseling Never smoker  Clinical Intake:  Pre-visit preparation completed: Yes  Pain : 0-10 Pain Score: 6  Pain Type: Chronic pain Pain Location: Back Pain Radiating Towards: Right leg Pain Onset: More than a month ago Pain Frequency: Constant Pain Relieving Factors: Robaxin  Pain Relieving Factors: Robaxin  Nutritional Status: BMI 25 -29 Overweight Nutritional Risks: None Diabetes: Yes CBG done?: No Did pt. bring in CBG monitor from home?: No (virtual visit)  How often do you need to have someone help you when you read instructions, pamphlets, or other written materials from your doctor or pharmacy?: 1 - Never What is  the last grade level you completed in school?: High school graduate  Nutrition Risk Assessment:  Has the patient had any N/V/D within the last 2 months?  No  Does the patient have any non-healing wounds?  No  Has the patient had any unintentional weight loss or weight gain?  No   Diabetes:  Is the patient diabetic?  Yes  If diabetic, was a CBG obtained today?  No  Did the patient bring in their glucometer from home?  No virtual visit How often do you monitor your CBG's? Never.   Financial Strains and Diabetes Management:  Are you having any financial strains with the device, your supplies or your medication? No .  Does the patient want to be seen by Chronic Care Management for management of their diabetes?  No  Would the patient like to be referred to a Nutritionist or for Diabetic Management?  No   Diabetic Exams:  Diabetic Eye Exam: Completed 02/14/2020.   Diabetic Foot Exam: . Pt has been advised about the importance in completing this exam. Discuss with PCP at next office visit.  Interpreter Needed?: No  Information entered by :: Caroleen Hamman LPN   Activities of Daily Living In your present state of health, do you have any difficulty performing the following activities: 04/03/2020 02/01/2020  Hearing?  N N  Vision? N N  Difficulty concentrating or making decisions? Y N  Comment difficulty concentrating at times -  Walking or climbing stairs? N N  Dressing or bathing? N N  Doing errands, shopping? N N  Preparing Food and eating ? N -  Using the Toilet? N -  In the past six months, have you accidently leaked urine? N -  Do you have problems with loss of bowel control? N -  Managing your Medications? N -  Managing your Finances? N -  Housekeeping or managing your Housekeeping? N -  Some recent data might be hidden     Immunizations and Health Maintenance  There is no immunization history on file for this patient. Health Maintenance Due  Topic Date Due  . PNEUMOCOCCAL POLYSACCHARIDE VACCINE AGE 77-64 HIGH RISK  Never done  . COVID-19 Vaccine (1) Never done  . TETANUS/TDAP  Never done  . FOOT EXAM  03/23/2019    Patient Care Team: [name], MD as PCP - General (Family Medicine) Marlaine Hind, MD as Consulting Physician (Physical Medicine and Rehabilitation) Star Age, MD as Attending Physician (Neurology) Marylynn Pearson, MD as Consulting Physician (Obstetrics and Gynecology) Inocencio Homes, DPM as Consulting Physician (Podiatry) [name], [facility] as Pharmacist (Pharmacist)  Indicate any recent Medical Services you may have received from other than [facility] providers in the past year (date may be approximate).     Assessment:   This is a routine wellness examination for [place].  Hearing/Vision screen  Hearing Screening   125Hz  250Hz  500Hz  1000Hz  2000Hz  3000Hz  4000Hz  6000Hz  8000Hz   Right ear:           Left ear:           Comments: No issues  Vision Screening Comments: Wears glasses Last eye exam-02/14/2020-[facility]  Dietary issues and exercise activities discussed: Current Exercise Habits: Home exercise routine, Type of exercise: strength training/weights;Other - see comments (Exercise bike), Time (Minutes): 10, Frequency (Times/Week): 3, Weekly Exercise  (Minutes/Week): 30, Intensity: Mild, Exercise limited by: orthopedic condition(s)  Goals Addressed            This Visit's Progress   . Patient  Stated       Drink more water      Depression Screen PHQ 2/9 Scores 04/03/2020 02/01/2020 02/24/2019 10/26/2018 07/19/2018 03/22/2018 08/05/2017  PHQ - 2 Score 2 2 1 1 1 2 2   PHQ- 9 Score 13 13 1 1 1 10 14     Fall Risk Fall Risk  04/03/2020 02/01/2020 02/24/2019 10/26/2018 07/19/2018  Falls in the past year? 1 0 0 0 No  Number falls in past yr: 0 0 0 - -  Injury with Fall? 0 0 0 - -  Risk for fall due to : - - - - -  Follow up - Falls evaluation completed - - -    FALL RISK PREVENTION PERTAINING TO THE HOME:  Any stairs in or around the home? No    Home free of loose throw rugs in walkways, pet beds, electrical cords, etc? Yes  Adequate lighting in your home to reduce risk of falls? Yes   ASSISTIVE DEVICES UTILIZED TO PREVENT FALLS:  Life alert? No  Use of a cane, walker or w/c? Yes  Grab bars in the bathroom? Yes  Shower chair or bench in shower? No  Elevated toilet seat or a handicapped toilet? No    TIMED UP AND GO:  Was the test performed? No . Virtual Visit    Cognitive Function: No cognitive impairment noted. Patient states she reads, does crossword puzzles & plays games on her phone frequently.        Screening Tests Health Maintenance  Topic Date Due  . PNEUMOCOCCAL POLYSACCHARIDE VACCINE AGE 55-64 HIGH RISK  Never done  . COVID-19 Vaccine (1) Never done  . TETANUS/TDAP  Never done  . FOOT EXAM  03/23/2019  . Hepatitis C Screening  01/31/2021 (Originally 10-26-1960)  . HIV Screening  01/31/2021 (Originally 11/24/1975)  . INFLUENZA VACCINE  05/20/2020  . HEMOGLOBIN A1C  08/03/2020  . URINE MICROALBUMIN  02/01/2021  . OPHTHALMOLOGY EXAM  02/13/2021  . MAMMOGRAM  02/05/2022  . PAP SMEAR-Modifier  02/06/2023  . COLONOSCOPY  12/31/2026    Qualifies for Shingles Vaccine? Yes  . Due for Shingrix. Education has been  provided regarding the importance of this vaccine. Pt has been advised to call insurance company to determine out of pocket expense. Advised may also receive vaccine at local pharmacy or Health Dept. Verbalized acceptance and understanding.  Tdap: Although this vaccine is not a covered service during a Wellness Exam, does the patient still wish to receive this vaccine today?  No . Virtual visit Education has been provided regarding the importance of this vaccine. Advised may receive this vaccine at local pharmacy or Health Dept. Aware to provide a copy of the vaccination record if obtained from local pharmacy or Health Dept. Verbalized acceptance and understanding.  Flu Vaccine: Due 06/2020  Pneumococcal Vaccine: Due for Pneumococcal vaccine. Does the patient want to receive this vaccine today?  No .Virtual visit Education has been provided regarding the importance of this vaccine. Advised may receive this vaccine at local pharmacy or Health Dept. Aware to provide a copy of the vaccination record if obtained from local pharmacy or Health Dept. Verbalized acceptance and understanding.   COVID-19 vaccine:  Information provided   Cancer Screenings:  Colorectal Screening: Completed 12/30/2016. Repeat every 10 years.  Mammogram: Completed 02/06/2020. Repeat every year.   Lung Cancer Screening: (Low Dose CT Chest recommended if Age 90-80 years, 30 pack-year currently smoking OR have quit w/in 15years.) does not qualify.   Additional Screening:  Hepatitis C Screening: does qualify. Discuss with PCP .   Dental Screening: Recommended annual dental exams for proper oral hygiene  Community Resource Referral:  CRR required this visit?  Yes   Due to transportaion needs     Plan:  I have personally reviewed and addressed the Medicare Annual Wellness questionnaire and have noted the following in the patient's chart:  A. Medical and social history B. Use of alcohol, tobacco or illicit drugs   C. Current medications and supplements D. Functional ability and status E.  Nutritional status F.  Physical activity G. Advance directives H. List of other physicians I.  Hospitalizations, surgeries, and ER visits in previous 12 months J.  [place] such as hearing and vision if needed, cognitive and depression L. Referrals and appointments   In addition, I have reviewed and discussed with patient certain preventive protocols, quality metrics, and best practice recommendations. A written personalized care plan for preventive services as well as general preventive health recommendations were provided to patient.  Due to this being a telephonic visit, the after visit summary with patients personalized plan was offered to patient via mail or my-chart.Patient was mailed a copy of AVS.    Signed,    Marta Antu, LPN   6/83/4196  Nurse Health Advisor    Nurse Notes: Patient scored a 13 on her depression screening. States she has been a little down lately due to recent stress related to her breast health issues. Previous score in April also a 13. Instructed patient to make an appt with PCP to discuss her depression symptoms but she states she does not want to make an appt until after she has the breast issues resolved. She states she will call back for appt if symptoms worsen. Denies suicidal thoughts.  Reviewed documentation provided by LPN.  Unfortunately I cannot make her schedule an appt to discuss her depression but I would love to discuss and adjust her medication.  Will follow up w/ pt.  Annye Asa, MD

## 2020-04-03 ENCOUNTER — Other Ambulatory Visit: Payer: Self-pay

## 2020-04-03 ENCOUNTER — Ambulatory Visit (INDEPENDENT_AMBULATORY_CARE_PROVIDER_SITE_OTHER): Payer: Medicare Other

## 2020-04-03 VITALS — Ht 67.0 in | Wt 184.0 lb

## 2020-04-03 DIAGNOSIS — Z0000 Encounter for general adult medical examination without abnormal findings: Secondary | ICD-10-CM

## 2020-04-03 NOTE — Patient Instructions (Signed)
Crystal Buchanan , Thank you for taking time to come for your Medicare Wellness Visit. I appreciate your ongoing commitment to your health goals. Please review the following plan we discussed and let me know if I can assist you in the future.   Screening recommendations/referrals: Colonoscopy: 12/30/2016-Due 12/31/2026 Mammogram: Completed 02/05/2020-Due 12/07/2020 Bone Density: Not indicated Recommended yearly ophthalmology/optometry visit for glaucoma screening and checkup Recommended yearly dental visit for hygiene and checkup  Vaccinations: Influenza vaccine: Due 06/2020 Pneumococcal vaccine: Discuss with PCP at next office visit Tdap vaccine: Due-Discuss with pharmacy/insurance Shingles vaccine: Due-Discuss with pharmacy/insurance  Covid-19: Discussed  Advanced directives: Information mailed  Conditions/risks identified: See problem list  If Depression symptoms persist or worsen call the office to schedule an appointment with PCP.  Community Resource Referral placed for transportation needs  Next appointment: Follow up in one year for your annual wellness visit.   Preventive Care 40-64 Years, Female Preventive care refers to lifestyle choices and visits with your health care provider that can promote health and wellness. What does preventive care include?  A yearly physical exam. This is also called an annual well check.  Dental exams once or twice a year.  Routine eye exams. Ask your health care provider how often you should have your eyes checked.  Personal lifestyle choices, including:  Daily care of your teeth and gums.  Regular physical activity.  Eating a healthy diet.  Avoiding tobacco and drug use.  Limiting alcohol use.  Practicing safe sex.  Taking low-dose aspirin daily starting at age 30.  Taking vitamin and mineral supplements as recommended by your health care provider. What happens during an annual well check? The services and screenings done by your  health care provider during your annual well check will depend on your age, overall health, lifestyle risk factors, and family history of disease. Counseling  Your health care provider may ask you questions about your:  Alcohol use.  Tobacco use.  Drug use.  Emotional well-being.  Home and relationship well-being.  Sexual activity.  Eating habits.  Work and work Statistician.  Method of birth control.  Menstrual cycle.  Pregnancy history. Screening  You may have the following tests or measurements:  Height, weight, and BMI.  Blood pressure.  Lipid and cholesterol levels. These may be checked every 5 years, or more frequently if you are over 76 years old.  Skin check.  Lung cancer screening. You may have this screening every year starting at age 37 if you have a 30-pack-year history of smoking and currently smoke or have quit within the past 15 years.  Fecal occult blood test (FOBT) of the stool. You may have this test every year starting at age 69.  Flexible sigmoidoscopy or colonoscopy. You may have a sigmoidoscopy every 5 years or a colonoscopy every 10 years starting at age 24.  Hepatitis C blood test.  Hepatitis B blood test.  Sexually transmitted disease (STD) testing.  Diabetes screening. This is done by checking your blood sugar (glucose) after you have not eaten for a while (fasting). You may have this done every 1-3 years.  Mammogram. This may be done every 1-2 years. Talk to your health care provider about when you should start having regular mammograms. This may depend on whether you have a family history of breast cancer.  BRCA-related cancer screening. This may be done if you have a family history of breast, ovarian, tubal, or peritoneal cancers.  Pelvic exam and Pap test. This may be done every  3 years starting at age 64. Starting at age 82, this may be done every 5 years if you have a Pap test in combination with an HPV test.  Bone density scan.  This is done to screen for osteoporosis. You may have this scan if you are at high risk for osteoporosis. Discuss your test results, treatment options, and if necessary, the need for more tests with your health care provider. Vaccines  Your health care provider may recommend certain vaccines, such as:  Influenza vaccine. This is recommended every year.  Tetanus, diphtheria, and acellular pertussis (Tdap, Td) vaccine. You may need a Td booster every 10 years.  Zoster vaccine. You may need this after age 48.  Pneumococcal 13-valent conjugate (PCV13) vaccine. You may need this if you have certain conditions and were not previously vaccinated.  Pneumococcal polysaccharide (PPSV23) vaccine. You may need one or two doses if you smoke cigarettes or if you have certain conditions. Talk to your health care provider about which screenings and vaccines you need and how often you need them. This information is not intended to replace advice given to you by your health care provider. Make sure you discuss any questions you have with your health care provider. Document Released: 11/02/2015 Document Revised: 06/25/2016 Document Reviewed: 08/07/2015 Elsevier Interactive Patient Education  2017 [place] Prevention in the Home Falls can cause injuries. They can happen to people of all ages. There are many things you can do to make your home safe and to help prevent falls. What can I do on the outside of my home?  Regularly fix the edges of walkways and driveways and fix any cracks.  Remove anything that might make you trip as you walk through a door, such as a raised step or threshold.  Trim any bushes or trees on the path to your home.  Use bright outdoor lighting.  Clear any walking paths of anything that might make someone trip, such as rocks or tools.  Regularly check to see if handrails are loose or broken. Make sure that both sides of any steps have handrails.  Any raised decks  and porches should have guardrails on the edges.  Have any leaves, snow, or ice cleared regularly.  Use sand or salt on walking paths during winter.  Clean up any spills in your garage right away. This includes oil or grease spills. What can I do in the bathroom?  Use night lights.  Install grab bars by the toilet and in the tub and shower. Do not use towel bars as grab bars.  Use non-skid mats or decals in the tub or shower.  If you need to sit down in the shower, use a plastic, non-slip stool.  Keep the floor dry. Clean up any water that spills on the floor as soon as it happens.  Remove soap buildup in the tub or shower regularly.  Attach bath mats securely with double-sided non-slip rug tape.  Do not have throw rugs and other things on the floor that can make you trip. What can I do in the bedroom?  Use night lights.  Make sure that you have a light by your bed that is easy to reach.  Do not use any sheets or blankets that are too big for your bed. They should not hang down onto the floor.  Have a firm chair that has side arms. You can use this for support while you get dressed.  Do not have throw  rugs and other things on the floor that can make you trip. What can I do in the kitchen?  Clean up any spills right away.  Avoid walking on wet floors.  Keep items that you use a lot in easy-to-reach places.  If you need to reach something above you, use a strong step stool that has a grab bar.  Keep electrical cords out of the way.  Do not use floor polish or wax that makes floors slippery. If you must use wax, use non-skid floor wax.  Do not have throw rugs and other things on the floor that can make you trip. What can I do with my stairs?  Do not leave any items on the stairs.  Make sure that there are handrails on both sides of the stairs and use them. Fix handrails that are broken or loose. Make sure that handrails are as long as the stairways.  Check any  carpeting to make sure that it is firmly attached to the stairs. Fix any carpet that is loose or worn.  Avoid having throw rugs at the top or bottom of the stairs. If you do have throw rugs, attach them to the floor with carpet tape.  Make sure that you have a light switch at the top of the stairs and the bottom of the stairs. If you do not have them, ask someone to add them for you. What else can I do to help prevent falls?  Wear shoes that:  Do not have high heels.  Have rubber bottoms.  Are comfortable and fit you well.  Are closed at the toe. Do not wear sandals.  If you use a stepladder:  Make sure that it is fully opened. Do not climb a closed stepladder.  Make sure that both sides of the stepladder are locked into place.  Ask someone to hold it for you, if possible.  Clearly mark and make sure that you can see:  Any grab bars or handrails.  First and last steps.  Where the edge of each step is.  Use tools that help you move around (mobility aids) if they are needed. These include:  Canes.  Walkers.  Scooters.  Crutches.  Turn on the lights when you go into a dark area. Replace any light bulbs as soon as they burn out.  Set up your furniture so you have a clear path. Avoid moving your furniture around.  If any of your floors are uneven, fix them.  If there are any pets around you, be aware of where they are.  Review your medicines with your doctor. Some medicines can make you feel dizzy. This can increase your chance of falling. Ask your doctor what other things that you can do to help prevent falls. This information is not intended to replace advice given to you by your health care provider. Make sure you discuss any questions you have with your health care provider. Document Released: 08/02/2009 Document Revised: 03/13/2016 Document Reviewed: 11/10/2014 Elsevier Interactive Patient Education  2017 [employer].

## 2020-04-10 ENCOUNTER — Encounter (HOSPITAL_BASED_OUTPATIENT_CLINIC_OR_DEPARTMENT_OTHER): Payer: Self-pay | Admitting: Surgery

## 2020-04-10 ENCOUNTER — Other Ambulatory Visit: Payer: Self-pay

## 2020-04-13 ENCOUNTER — Encounter (HOSPITAL_BASED_OUTPATIENT_CLINIC_OR_DEPARTMENT_OTHER)
Admission: RE | Admit: 2020-04-13 | Discharge: 2020-04-13 | Disposition: A | Discharge status: Home / Self Care | Payer: Medicare Other | Source: Ambulatory Visit | Attending: Surgery | Admitting: Surgery

## 2020-04-13 DIAGNOSIS — Z8349 Family history of other endocrine, nutritional and metabolic diseases: Secondary | ICD-10-CM | POA: Diagnosis not present

## 2020-04-13 DIAGNOSIS — M1990 Unspecified osteoarthritis, unspecified site: Secondary | ICD-10-CM | POA: Diagnosis not present

## 2020-04-13 DIAGNOSIS — Z888 Allergy status to other drugs, medicaments and biological substances status: Secondary | ICD-10-CM | POA: Diagnosis not present

## 2020-04-13 DIAGNOSIS — D242 Benign neoplasm of left breast: Secondary | ICD-10-CM | POA: Diagnosis not present

## 2020-04-13 DIAGNOSIS — M549 Dorsalgia, unspecified: Secondary | ICD-10-CM | POA: Diagnosis not present

## 2020-04-13 DIAGNOSIS — D649 Anemia, unspecified: Secondary | ICD-10-CM | POA: Diagnosis not present

## 2020-04-13 DIAGNOSIS — R519 Headache, unspecified: Secondary | ICD-10-CM | POA: Diagnosis not present

## 2020-04-13 DIAGNOSIS — Z86010 Personal history of colonic polyps: Secondary | ICD-10-CM | POA: Diagnosis not present

## 2020-04-13 DIAGNOSIS — E119 Type 2 diabetes mellitus without complications: Secondary | ICD-10-CM | POA: Diagnosis not present

## 2020-04-13 DIAGNOSIS — Z885 Allergy status to narcotic agent status: Secondary | ICD-10-CM | POA: Diagnosis not present

## 2020-04-13 DIAGNOSIS — Z8249 Family history of ischemic heart disease and other diseases of the circulatory system: Secondary | ICD-10-CM | POA: Diagnosis not present

## 2020-04-13 DIAGNOSIS — G709 Myoneural disorder, unspecified: Secondary | ICD-10-CM | POA: Diagnosis not present

## 2020-04-13 DIAGNOSIS — F419 Anxiety disorder, unspecified: Secondary | ICD-10-CM | POA: Diagnosis not present

## 2020-04-13 DIAGNOSIS — K219 Gastro-esophageal reflux disease without esophagitis: Secondary | ICD-10-CM | POA: Diagnosis not present

## 2020-04-13 DIAGNOSIS — Z8261 Family history of arthritis: Secondary | ICD-10-CM | POA: Diagnosis not present

## 2020-04-13 DIAGNOSIS — G4730 Sleep apnea, unspecified: Secondary | ICD-10-CM | POA: Diagnosis not present

## 2020-04-13 DIAGNOSIS — Z811 Family history of alcohol abuse and dependence: Secondary | ICD-10-CM | POA: Diagnosis not present

## 2020-04-13 LAB — BASIC METABOLIC PANEL
Anion gap: 9 (ref 5–15)
BUN: 10 mg/dL (ref 6–20)
CO2: 28 mmol/L (ref 22–32)
Calcium: 9.3 mg/dL (ref 8.9–10.3)
Chloride: 104 mmol/L (ref 98–111)
Creatinine, Ser: 0.81 mg/dL (ref 0.44–1.00)
GFR calc Af Amer: >60 mL/min (ref >60)
GFR calc non Af Amer: >60 mL/min (ref >60)
Glucose, Bld: 103 mg/dL — ABNORMAL HIGH (ref 70–99)
Potassium: 4.6 mmol/L (ref 3.5–5.1)
Sodium: 141 mmol/L (ref 135–145)

## 2020-04-13 NOTE — Progress Notes (Signed)

## 2020-04-14 ENCOUNTER — Other Ambulatory Visit (HOSPITAL_COMMUNITY)
Admission: RE | Admit: 2020-04-14 | Discharge: 2020-04-14 | Disposition: A | Discharge status: Home / Self Care | Payer: Medicare Other | Source: Ambulatory Visit | Attending: Surgery | Admitting: Surgery

## 2020-04-14 DIAGNOSIS — Z01812 Encounter for preprocedural laboratory examination: Secondary | ICD-10-CM | POA: Insufficient documentation

## 2020-04-14 DIAGNOSIS — Z20822 Contact with and (suspected) exposure to covid-19: Secondary | ICD-10-CM | POA: Insufficient documentation

## 2020-04-14 LAB — SARS CORONAVIRUS 2 (TAT 6-24 HRS): SARS Coronavirus 2: NEGATIVE

## 2020-04-17 ENCOUNTER — Other Ambulatory Visit: Payer: Self-pay

## 2020-04-17 ENCOUNTER — Ambulatory Visit
Admission: RE | Admit: 2020-04-17 | Discharge: 2020-04-17 | Disposition: A | Discharge status: Home / Self Care | Payer: Medicare Other | Source: Ambulatory Visit | Attending: Surgery | Admitting: Surgery

## 2020-04-17 DIAGNOSIS — D242 Benign neoplasm of left breast: Secondary | ICD-10-CM

## 2020-04-17 NOTE — Anesthesia Preprocedure Evaluation (Addendum)
Anesthesia Evaluation  Patient identified by MRN, date of birth, ID band Patient awake    Reviewed: Allergy & Precautions, NPO status , Patient's Chart, lab work & pertinent test results  Airway Mallampati: II  TM Distance: >3 FB Neck ROM: Full    Dental no notable dental hx. (+) Teeth Intact, Dental Advisory Given   Pulmonary sleep apnea ,    Pulmonary exam normal breath sounds clear to auscultation       Cardiovascular Exercise Tolerance: Good Normal cardiovascular exam Rhythm:Regular Rate:Normal     Neuro/Psych  Headaches, PSYCHIATRIC DISORDERS  Neuromuscular disease    GI/Hepatic Neg liver ROS, GERD  Medicated and Controlled,  Endo/Other  diabetes  Renal/GU negative Renal ROS     Musculoskeletal  (+) Arthritis ,   Abdominal   Peds  Hematology  (+) anemia ,   Anesthesia Other Findings   Reproductive/Obstetrics                            Anesthesia Physical Anesthesia Plan  ASA: III  Anesthesia Plan: General   Post-op Pain Management:    Induction: Intravenous  PONV Risk Score and Plan: 4 or greater and Treatment may vary due to age or medical condition, Ondansetron, Dexamethasone and Midazolam  Airway Management Planned: LMA  Additional Equipment: None  Intra-op Plan:   Post-operative Plan:   Informed Consent: I have reviewed the patients History and Physical, chart, labs and discussed the procedure including the risks, benefits and alternatives for the proposed anesthesia with the patient or authorized representative who has indicated his/her understanding and acceptance.     Dental advisory given  Plan Discussed with: CRNA  Anesthesia Plan Comments:        Anesthesia Quick Evaluation

## 2020-04-18 ENCOUNTER — Other Ambulatory Visit: Payer: Self-pay

## 2020-04-18 ENCOUNTER — Encounter (HOSPITAL_BASED_OUTPATIENT_CLINIC_OR_DEPARTMENT_OTHER): Payer: Self-pay | Admitting: Surgery

## 2020-04-18 ENCOUNTER — Ambulatory Visit (HOSPITAL_BASED_OUTPATIENT_CLINIC_OR_DEPARTMENT_OTHER)
Admission: RE | Admit: 2020-04-18 | Discharge: 2020-04-18 | Disposition: A | Discharge status: Home / Self Care | Payer: Medicare Other | Attending: Surgery | Admitting: Surgery

## 2020-04-18 ENCOUNTER — Ambulatory Visit
Admission: RE | Admit: 2020-04-18 | Discharge: 2020-04-18 | Disposition: A | Discharge status: Home / Self Care | Payer: Medicare Other | Source: Ambulatory Visit | Attending: Surgery | Admitting: Surgery

## 2020-04-18 ENCOUNTER — Encounter (HOSPITAL_BASED_OUTPATIENT_CLINIC_OR_DEPARTMENT_OTHER)
Admission: RE | Disposition: A | Discharge status: Home / Self Care | Payer: Self-pay | Source: Home / Self Care | Attending: Surgery

## 2020-04-18 ENCOUNTER — Ambulatory Visit (HOSPITAL_BASED_OUTPATIENT_CLINIC_OR_DEPARTMENT_OTHER): Payer: Medicare Other | Admitting: Anesthesiology

## 2020-04-18 DIAGNOSIS — D649 Anemia, unspecified: Secondary | ICD-10-CM | POA: Insufficient documentation

## 2020-04-18 DIAGNOSIS — G4730 Sleep apnea, unspecified: Secondary | ICD-10-CM | POA: Insufficient documentation

## 2020-04-18 DIAGNOSIS — D242 Benign neoplasm of left breast: Secondary | ICD-10-CM | POA: Insufficient documentation

## 2020-04-18 DIAGNOSIS — Z86010 Personal history of colonic polyps: Secondary | ICD-10-CM | POA: Insufficient documentation

## 2020-04-18 DIAGNOSIS — Z8349 Family history of other endocrine, nutritional and metabolic diseases: Secondary | ICD-10-CM | POA: Insufficient documentation

## 2020-04-18 DIAGNOSIS — Z8249 Family history of ischemic heart disease and other diseases of the circulatory system: Secondary | ICD-10-CM | POA: Insufficient documentation

## 2020-04-18 DIAGNOSIS — G709 Myoneural disorder, unspecified: Secondary | ICD-10-CM | POA: Diagnosis not present

## 2020-04-18 DIAGNOSIS — R519 Headache, unspecified: Secondary | ICD-10-CM | POA: Diagnosis not present

## 2020-04-18 DIAGNOSIS — K219 Gastro-esophageal reflux disease without esophagitis: Secondary | ICD-10-CM | POA: Insufficient documentation

## 2020-04-18 DIAGNOSIS — Z885 Allergy status to narcotic agent status: Secondary | ICD-10-CM | POA: Insufficient documentation

## 2020-04-18 DIAGNOSIS — Z8261 Family history of arthritis: Secondary | ICD-10-CM | POA: Insufficient documentation

## 2020-04-18 DIAGNOSIS — M1990 Unspecified osteoarthritis, unspecified site: Secondary | ICD-10-CM | POA: Insufficient documentation

## 2020-04-18 DIAGNOSIS — Z811 Family history of alcohol abuse and dependence: Secondary | ICD-10-CM | POA: Insufficient documentation

## 2020-04-18 DIAGNOSIS — F419 Anxiety disorder, unspecified: Secondary | ICD-10-CM | POA: Insufficient documentation

## 2020-04-18 DIAGNOSIS — E119 Type 2 diabetes mellitus without complications: Secondary | ICD-10-CM | POA: Insufficient documentation

## 2020-04-18 DIAGNOSIS — M549 Dorsalgia, unspecified: Secondary | ICD-10-CM | POA: Insufficient documentation

## 2020-04-18 DIAGNOSIS — Z888 Allergy status to other drugs, medicaments and biological substances status: Secondary | ICD-10-CM | POA: Insufficient documentation

## 2020-04-18 HISTORY — DX: Unspecified lump in the left breast, unspecified quadrant: N63.20

## 2020-04-18 HISTORY — PX: BREAST LUMPECTOMY WITH RADIOACTIVE SEED LOCALIZATION: SHX6424

## 2020-04-18 HISTORY — DX: Type 2 diabetes mellitus without complications: E11.9

## 2020-04-18 LAB — GLUCOSE, CAPILLARY
Glucose-Capillary: 98 mg/dL (ref 70–99)
Glucose-Capillary: 95 mg/dL (ref 70–99)

## 2020-04-18 SURGERY — BREAST LUMPECTOMY WITH RADIOACTIVE SEED LOCALIZATION
Anesthesia: General | Site: Breast | Laterality: Left

## 2020-04-18 MED ORDER — FENTANYL CITRATE (PF) 100 MCG/2ML IJ SOLN
INTRAMUSCULAR | Status: AC
  Filled 2020-04-18: qty 2

## 2020-04-18 MED ORDER — ONDANSETRON HCL 4 MG/2ML IJ SOLN
INTRAMUSCULAR | Status: DC | PRN
  Administered 2020-04-18: 4 mg via INTRAVENOUS

## 2020-04-18 MED ORDER — OXYCODONE HCL 5 MG PO TABS
ORAL_TABLET | ORAL | Status: AC
  Filled 2020-04-18: qty 1

## 2020-04-18 MED ORDER — ONDANSETRON HCL 4 MG/2ML IJ SOLN: 4.0000 mg | Freq: Once | INTRAMUSCULAR | Status: DC | PRN

## 2020-04-18 MED ORDER — KETOROLAC TROMETHAMINE 30 MG/ML IJ SOLN: 30.0000 mg | Freq: Once | INTRAMUSCULAR | Status: DC | PRN

## 2020-04-18 MED ORDER — CHLORHEXIDINE GLUCONATE CLOTH 2 % EX PADS: 6.0000 | MEDICATED_PAD | Freq: Once | CUTANEOUS | Status: DC

## 2020-04-18 MED ORDER — FENTANYL CITRATE (PF) 100 MCG/2ML IJ SOLN
INTRAMUSCULAR | Status: DC | PRN
  Administered 2020-04-18: 50 ug via INTRAVENOUS

## 2020-04-18 MED ORDER — LIDOCAINE 2% (20 MG/ML) 5 ML SYRINGE
INTRAMUSCULAR | Status: AC
  Filled 2020-04-18: qty 5

## 2020-04-18 MED ORDER — LACTATED RINGERS IV SOLN: INTRAVENOUS | Status: DC

## 2020-04-18 MED ORDER — PROPOFOL 10 MG/ML IV BOLUS
INTRAVENOUS | Status: AC
  Filled 2020-04-18: qty 20

## 2020-04-18 MED ORDER — LIDOCAINE HCL (CARDIAC) PF 100 MG/5ML IV SOSY
PREFILLED_SYRINGE | INTRAVENOUS | Status: DC | PRN
  Administered 2020-04-18: 80 mg via INTRAVENOUS

## 2020-04-18 MED ORDER — BUPIVACAINE HCL (PF) 0.25 % IJ SOLN
INTRAMUSCULAR | Status: DC | PRN
  Administered 2020-04-18: 10 mL

## 2020-04-18 MED ORDER — ACETAMINOPHEN 500 MG PO TABS
1000.0000 mg | ORAL_TABLET | ORAL | Status: AC
  Administered 2020-04-18: 1000 mg via ORAL

## 2020-04-18 MED ORDER — CEFAZOLIN SODIUM-DEXTROSE 2-4 GM/100ML-% IV SOLN
INTRAVENOUS | Status: AC
  Filled 2020-04-18: qty 100

## 2020-04-18 MED ORDER — PROPOFOL 10 MG/ML IV BOLUS
INTRAVENOUS | Status: DC | PRN
  Administered 2020-04-18: 160 mg via INTRAVENOUS

## 2020-04-18 MED ORDER — HYDROCODONE-ACETAMINOPHEN 5-325 MG PO TABS: 1.0000 | ORAL_TABLET | Freq: Four times a day (QID) | ORAL | 0 refills | Status: AC | PRN

## 2020-04-18 MED ORDER — MIDAZOLAM HCL 2 MG/2ML IJ SOLN
INTRAMUSCULAR | Status: AC
  Filled 2020-04-18: qty 2

## 2020-04-18 MED ORDER — OXYCODONE HCL 5 MG PO TABS
5.0000 mg | ORAL_TABLET | Freq: Once | ORAL | Status: AC | PRN
  Administered 2020-04-18: 5 mg via ORAL

## 2020-04-18 MED ORDER — MIDAZOLAM HCL 5 MG/5ML IJ SOLN
INTRAMUSCULAR | Status: DC | PRN
  Administered 2020-04-18: 2 mg via INTRAVENOUS

## 2020-04-18 MED ORDER — ACETAMINOPHEN 500 MG PO TABS
ORAL_TABLET | ORAL | Status: AC
  Filled 2020-04-18: qty 2

## 2020-04-18 MED ORDER — CEFAZOLIN SODIUM-DEXTROSE 2-4 GM/100ML-% IV SOLN: 2.0000 g | INTRAVENOUS | Status: DC

## 2020-04-18 MED ORDER — HYDROMORPHONE HCL 1 MG/ML IJ SOLN
0.2500 mg | INTRAMUSCULAR | Status: DC | PRN
  Administered 2020-04-18: 0.25 mg via INTRAVENOUS
  Administered 2020-04-18: 0.5 mg via INTRAVENOUS

## 2020-04-18 MED ORDER — HYDROMORPHONE HCL 1 MG/ML IJ SOLN
INTRAMUSCULAR | Status: AC
  Filled 2020-04-18: qty 0.5

## 2020-04-18 MED ORDER — OXYCODONE HCL 5 MG/5ML PO SOLN: 5.0000 mg | Freq: Once | ORAL | Status: AC | PRN

## 2020-04-18 MED ORDER — DEXAMETHASONE SODIUM PHOSPHATE 4 MG/ML IJ SOLN
INTRAMUSCULAR | Status: DC | PRN
  Administered 2020-04-18: 5 mg via INTRAVENOUS

## 2020-04-18 SURGICAL SUPPLY — 40 items
APPLIER CLIP 9.375 MED OPEN (MISCELLANEOUS) ×3
BENZOIN TINCTURE PRP APPL 2/3 (GAUZE/BANDAGES/DRESSINGS) ×3 IMPLANT
BLADE HEX COATED 2.75 (ELECTRODE) ×3 IMPLANT
BLADE SURG 15 STRL LF DISP TIS (BLADE) ×1 IMPLANT
BLADE SURG 15 STRL SS (BLADE) ×2
CHLORAPREP W/TINT 26 (MISCELLANEOUS) ×3 IMPLANT
CLIP APPLIE 9.375 MED OPEN (MISCELLANEOUS) ×1 IMPLANT
CLOSURE WOUND 1/2 X4 (GAUZE/BANDAGES/DRESSINGS) ×1
COVER BACK TABLE 60X90IN (DRAPES) ×3 IMPLANT
COVER MAYO STAND STRL (DRAPES) ×3 IMPLANT
COVER PROBE W GEL 5X96 (DRAPES) ×3 IMPLANT
DRAPE LAPAROTOMY 100X72 PEDS (DRAPES) ×3 IMPLANT
DRAPE UTILITY XL STRL (DRAPES) ×3 IMPLANT
DRSG TEGADERM 2-3/8X2-3/4 SM (GAUZE/BANDAGES/DRESSINGS) ×3 IMPLANT
DRSG TEGADERM 4X4.75 (GAUZE/BANDAGES/DRESSINGS) ×3 IMPLANT
ELECT REM PT RETURN 9FT ADLT (ELECTROSURGICAL) ×3
ELECTRODE REM PT RTRN 9FT ADLT (ELECTROSURGICAL) ×1 IMPLANT
GLOVE BIO SURGEON STRL SZ 6.5 (GLOVE) ×2 IMPLANT
GLOVE BIO SURGEON STRL SZ7 (GLOVE) ×3 IMPLANT
GLOVE BIO SURGEONS STRL SZ 6.5 (GLOVE) ×1
GLOVE BIOGEL PI IND STRL 7.5 (GLOVE) ×1 IMPLANT
GLOVE BIOGEL PI INDICATOR 7.5 (GLOVE) ×2
GOWN STRL REUS W/ TWL LRG LVL3 (GOWN DISPOSABLE) ×2 IMPLANT
GOWN STRL REUS W/TWL LRG LVL3 (GOWN DISPOSABLE) ×4
KIT MARKER MARGIN INK (KITS) ×3 IMPLANT
NEEDLE HYPO 25X1 1.5 SAFETY (NEEDLE) ×3 IMPLANT
NS IRRIG 1000ML POUR BTL (IV SOLUTION) ×3 IMPLANT
PENCIL SMOKE EVACUATOR (MISCELLANEOUS) ×3 IMPLANT
SET BASIN DAY SURGERY F.S. (CUSTOM PROCEDURE TRAY) ×3 IMPLANT
SLEEVE SCD COMPRESS KNEE MED (MISCELLANEOUS) ×3 IMPLANT
SPONGE GAUZE 2X2 8PLY STER LF (GAUZE/BANDAGES/DRESSINGS) ×1
SPONGE GAUZE 2X2 8PLY STRL LF (GAUZE/BANDAGES/DRESSINGS) ×2 IMPLANT
SPONGE LAP 4X18 RFD (DISPOSABLE) ×3 IMPLANT
STRIP CLOSURE SKIN 1/2X4 (GAUZE/BANDAGES/DRESSINGS) ×2 IMPLANT
SUT MON AB 4-0 PC3 18 (SUTURE) ×3 IMPLANT
SUT VIC AB 3-0 SH 27 (SUTURE) ×2
SUT VIC AB 3-0 SH 27X BRD (SUTURE) ×1 IMPLANT
SYR CONTROL 10ML LL (SYRINGE) ×3 IMPLANT
TOWEL GREEN STERILE FF (TOWEL DISPOSABLE) ×3 IMPLANT
TRAY FAXITRON CT DISP (TRAY / TRAY PROCEDURE) ×3 IMPLANT

## 2020-04-18 NOTE — Op Note (Signed)
Pre-op Diagnosis:  Intraductal papilloma left breast Post-op Diagnosis: same Procedure:  Left radioactive seed localized lumpectomy Surgeon:  Cj Beecher K. Anesthesia:  GEN - LMA Indications:  This is a 59 year old female with a history of a previous right lumpectomy in 2012 for benign disease who presents after recent screening mammogram. The mammogram showed some calcifications in the left breast. Further workup showed a mass edging 2.0 x 0.6 x 0.5 cm containing some calcifications. Ultrasound confirmed a 1.4 x 0.6 x 0.5 cm mass at 3:00 3 cm from the nipple posterior depth. The axilla was negative. She underwent biopsy on 02/27/20. Pathology showed a partially hyalinized intraductal papilloma with calcifications.  Description of procedure: The patient is brought to the operating room placed in supine position on the operating room table. After an adequate level of general anesthesia was obtained, her left breast was prepped with ChloraPrep and draped in sterile fashion. A timeout was taken to ensure the proper patient and proper procedure. We interrogated the breast with the neoprobe. We made a circumareolar incision around the upper side of the nipple after infiltrating with 0.25% Marcaine. Dissection was carried down in the breast tissue with cautery. We used the neoprobe to guide [name] towards the radioactive seed. We excised an area of tissue around the radioactive seed 2 cm in diameter. The specimen was removed and was oriented with a paint kit. Specimen mammogram showed the radioactive seed as well as the biopsy clip within the specimen. This was sent for pathologic examination. There is no residual radioactivity within the biopsy cavity. We inspected carefully for hemostasis. The wound was thoroughly irrigated. The wound was closed with a deep layer of 3-0 Vicryl and a subcuticular layer of 4-0 Monocryl. Benzoin Steri-Strips were applied. The patient was then extubated and brought to the  recovery room in stable condition. All sponge, instrument, and needle counts are correct.  Imogene Burn. [name], MD, [facility]  General/ Trauma Surgery  04/18/2020 10:20 AM

## 2020-04-18 NOTE — Transfer of Care (Signed)
Immediate Anesthesia Transfer of Care Note  Patient: Crystal Buchanan  Procedure(s) Performed: LEFT BREAST LUMPECTOMY WITH RADIOACTIVE SEED LOCALIZATION (Left Breast)  Patient Location: PACU  Anesthesia Type:General  Level of Consciousness: sedated  Airway & Oxygen Therapy: Patient Spontanous Breathing and Patient connected to face mask oxygen  Post-op Assessment: Report given to RN and Post -op Vital signs reviewed and stable  Post vital signs: Reviewed and stable  Last Vitals:  Vitals Value Taken Time  BP 128/81 04/18/20 1026  Temp 36.4 C 04/18/20 1026  Pulse 81 04/18/20 1029  Resp 13 04/18/20 1029  SpO2 99 % 04/18/20 1029  Vitals shown include unvalidated device data.  Last Pain:  Vitals:   04/18/20 0801  TempSrc: Oral  PainSc: 0-No pain      Patients Stated Pain Goal: 1 (16/94/50 3888)  Complications: No complications documented.

## 2020-04-18 NOTE — Anesthesia Postprocedure Evaluation (Signed)
Anesthesia Post Note  Patient: Crystal Buchanan  Procedure(s) Performed: LEFT BREAST LUMPECTOMY WITH RADIOACTIVE SEED LOCALIZATION (Left Breast)     Patient location during evaluation: PACU Anesthesia Type: General Level of consciousness: awake and alert Pain management: pain level controlled Vital Signs Assessment: post-procedure vital signs reviewed and stable Respiratory status: spontaneous breathing, nonlabored ventilation, respiratory function stable and patient connected to nasal cannula oxygen Cardiovascular status: blood pressure returned to baseline and stable Postop Assessment: no apparent nausea or vomiting Anesthetic complications: no   No complications documented.  Last Vitals:  Vitals:   04/18/20 1100 04/18/20 1124  BP: 139/87 (!) 147/99  Pulse: 64 69  Resp: 17 18  Temp:  36.5 C  SpO2: 98% 98%    Last Pain:  Vitals:   04/18/20 1113  TempSrc:   PainSc: 6                  Barnet Glasgow

## 2020-04-18 NOTE — Discharge Instructions (Signed)
Next dose of Tylenol at 1:15 PM Oxycodone given at 11:15am. Next dose of Norco can be given at 5: 15pm if needed.   [facility] Office Phone Number [phone]  BREAST BIOPSY/ PARTIAL MASTECTOMY: POST OP INSTRUCTIONS  Always review your discharge instruction sheet given to you by the facility where your surgery was performed.  IF YOU HAVE DISABILITY OR FAMILY LEAVE FORMS, YOU MUST BRING THEM TO THE OFFICE FOR PROCESSING.  DO NOT GIVE THEM TO YOUR DOCTOR.  1. A prescription for pain medication may be given to you upon discharge.  Take your pain medication as prescribed, if needed.  If narcotic pain medicine is not needed, then you may take acetaminophen (Tylenol) or ibuprofen (Advil) as needed. 2. Take your usually prescribed medications unless otherwise directed 3. If you need a refill on your pain medication, please contact your pharmacy.  They will contact our office to request authorization.  Prescriptions will not be filled after 5pm or on week-ends. 4. You should eat very light the first 24 hours after surgery, such as soup, crackers, pudding, etc.  Resume your normal diet the day after surgery. 5. Most patients will experience some swelling and bruising in the breast.  Ice packs and a good support bra will help.  Swelling and bruising can take several days to resolve.  6. It is common to experience some constipation if taking pain medication after surgery.  Increasing fluid intake and taking a stool softener will usually help or prevent this problem from occurring.  A mild laxative (Milk of Magnesia or Miralax) should be taken according to package directions if there are no bowel movements after 48 hours. 7. Unless discharge instructions indicate otherwise, you may remove your bandages 24-48 hours after surgery, and you may shower at that time.  You may have steri-strips (small skin tapes) in place directly over the incision.  These strips should be left on the skin for 7-10  days.  If your surgeon used skin glue on the incision, you may shower in 24 hours.  The glue will flake off over the next 2-3 weeks.  Any sutures or staples will be removed at the office during your follow-up visit. 8. ACTIVITIES:  You may resume regular daily activities (gradually increasing) beginning the next day.  Wearing a good support bra or sports bra minimizes pain and swelling.  You may have sexual intercourse when it is comfortable. a. You may drive when you no longer are taking prescription pain medication, you can comfortably wear a seatbelt, and you can safely maneuver your car and apply brakes. b. RETURN TO WORK:  ______________________________________________________________________________________ 9. You should see your doctor in the office for a follow-up appointment approximately two weeks after your surgery.  Your doctors nurse will typically make your follow-up appointment when she calls you with your pathology report.  Expect your pathology report 2-3 business days after your surgery.  You may call to check if you do not hear from [name] after three days. 10. OTHER INSTRUCTIONS: _______________________________________________________________________________________________ _____________________________________________________________________________________________________________________________________ _____________________________________________________________________________________________________________________________________ _____________________________________________________________________________________________________________________________________  WHEN TO CALL YOUR DOCTOR: 1. Fever over 101.0 2. Nausea and/or vomiting. 3. Extreme swelling or bruising. 4. Continued bleeding from incision. 5. Increased pain, redness, or drainage from the incision.  The clinic staff is available to answer your questions during regular business hours.  Please dont hesitate to call  and ask to speak to one of the nurses for clinical concerns.  If you have a medical emergency, go to the nearest emergency room  or call 911.  A surgeon from [facility] is always on call at the hospital.  For further questions, please visit [email or web address]    Post Anesthesia Home Care Instructions  Activity: Get plenty of rest for the remainder of the day. A responsible individual must stay with you for 24 hours following the procedure.  For the next 24 hours, DO NOT: -Drive a car -Paediatric nurse -Drink alcoholic beverages -Take any medication unless instructed by your physician -Make any legal decisions or sign important papers.  Meals: Start with liquid foods such as gelatin or soup. Progress to regular foods as tolerated. Avoid greasy, spicy, heavy foods. If nausea and/or vomiting occur, drink only clear liquids until the nausea and/or vomiting subsides. Call your physician if vomiting continues.  Special Instructions/Symptoms: Your throat may feel dry or sore from the anesthesia or the breathing tube placed in your throat during surgery. If this causes discomfort, gargle with warm salt water. The discomfort should disappear within 24 hours.  If you had a scopolamine patch placed behind your ear for the management of post- operative nausea and/or vomiting:  1. The medication in the patch is effective for 72 hours, after which it should be removed.  Wrap patch in a tissue and discard in the trash. Wash hands thoroughly with soap and water. 2. You may remove the patch earlier than 72 hours if you experience unpleasant side effects which may include dry mouth, dizziness or visual disturbances. 3. Avoid touching the patch. Wash your hands with soap and water after contact with the patch.

## 2020-04-18 NOTE — H&P (Signed)
History of Present Illness Crystal Buchanan. Crystal Shannahan MD; 03/09/2020 10:00 AM) The patient is a 59 year old female who presents with a breast mass. PCP - Crystal Asa, MD Referred by Crystal Pearson MD for left breast mass  This is a 59 year old female with a history of a previous right lumpectomy in 2012 for benign disease who presents after recent screening mammogram. The mammogram showed some calcifications in the left breast. Further workup showed a mass edging 2.0 x 0.6 x 0.5 cm containing some calcifications. Ultrasound confirmed a 1.4 x 0.6 x 0.5 cm mass at 3:00 3 cm from the nipple posterior depth. The axilla was negative. She underwent biopsy on 02/27/20. Pathology showed a partially hyalinized intraductal papilloma with calcifications. She presents now to discuss excision.  No family history of breast cancer.   Problem List/Past Medical Crystal Key K. Iliyana Convey, MD; 03/09/2020 10:00 AM) Crystal Buchanan PAPILLOMA OF BREAST, LEFT (D24.2)  Past Surgical History Crystal Buchanan, Crystal Buchanan; 03/09/2020 8:43 AM) Breast Biopsy Bilateral. Colon Polyp Removal - Colonoscopy Knee Buchanan Left.  Diagnostic Studies History Crystal Buchanan, [place]; 03/09/2020 8:43 AM) Colonoscopy 1-5 years ago Mammogram within last year  Allergies Crystal Buchanan, CMA; 03/09/2020 8:45 AM) Codeine Phosphate *ANALGESICS - OPIOID* Gabapentin *CHEMICALS* Lipitor *ANTIHYPERLIPIDEMICS* Lyrica *ANTICONVULSANTS* Allergies Reconciled  Medication History Crystal Buchanan, CMA; 03/09/2020 8:45 AM) ALPRAZolam (0.5MG  Tablet, Oral) Active. Pantoprazole Sodium (40MG  Tablet DR, Oral) Active. Rosuvastatin Calcium (20MG  Tablet, Oral) Active. Sertraline HCl (25MG  Tablet, Oral) Active. tiZANidine HCl (2MG  Tablet, Oral) Active. metFORMIN HCl (500MG  Tablet, Oral) Active. Methocarbamol (500MG  Tablet, Oral) Active. SUMAtriptan Succinate (50MG  Tablet, Oral) Active. Medications Reconciled  Social History Crystal Buchanan, [place]; 03/09/2020 8:43 AM) Alcohol use Remotely quit alcohol use. Caffeine use Coffee. No drug use Tobacco use Never smoker.  Family History Crystal Buchanan, [place]; 03/09/2020 8:43 AM) Alcohol Abuse Sister. Arthritis Father, Mother, Sister. Diabetes Mellitus Father, Mother. Heart Disease Father, Mother. Hypertension Father, Mother. Thyroid problems Son.  Pregnancy / Birth History Crystal Buchanan, [place]; 03/09/2020 8:43 AM) Age at menarche 54 years. Gravida 3 Maternal age 32-25 Para 3  Other Problems Crystal Buchanan. Crystal Raine, MD; 03/09/2020 10:00 AM) Anxiety Disorder Arthritis Back Pain Diabetes Mellitus Gastroesophageal Reflux Disease Hypercholesterolemia Lump In Breast     Review of Systems Crystal Buchanan CMA; 03/09/2020 8:43 AM) General Present- Weight Loss. Not Present- Appetite Loss, Chills, Fatigue, Fever, Night Sweats and Weight Gain. Skin Not Present- Change in Wart/Mole, Dryness, Hives, Jaundice, New Lesions, Non-Healing Wounds, Rash and Ulcer. HEENT Present- Seasonal Allergies and Wears glasses/contact lenses. Not Present- Earache, Hearing Loss, Hoarseness, Nose Bleed, Oral Ulcers, Ringing in the Ears, Sinus Pain, Sore Throat, Visual Disturbances and Yellow Eyes. Breast Present- Breast Mass. Not Present- Breast Pain, Nipple Discharge and Skin Changes. Gastrointestinal Not Present- Abdominal Pain, Bloating, Bloody Stool, Change in Bowel Habits, Chronic diarrhea, Constipation, Difficulty Swallowing, Excessive gas, Gets full quickly at meals, Hemorrhoids, Indigestion, Nausea, Rectal Pain and Vomiting. Musculoskeletal Present- Back Pain and Swelling of Extremities. Not Present- Joint Pain, Joint Stiffness, Muscle Pain and Muscle Weakness. Neurological Present- Headaches, Numbness, Tingling and Weakness. Not Present- Decreased Memory, Fainting, Seizures, Tremor and Trouble walking. Endocrine Present- New Diabetes. Not Present- Cold Intolerance,  Excessive Hunger, Hair Changes, Heat Intolerance and Hot flashes.  Vitals Crystal Buchanan CMA; 03/09/2020 8:44 AM) 03/09/2020 8:44 AM Weight: 186 lb Height: 68in Body Surface Area: 1.98 m Body Mass Index: 28.28 kg/m  Temp.: 97.39F  Pulse: 109 (Regular)  BP: 118/78(Sitting, Left Arm, Standard)        Physical Exam Crystal Key  Crystal Section MD; 03/09/2020 10:00 AM)  The physical exam findings are as follows: Note:Constitutional: WDWN in NAD, conversant, no obvious deformities; resting comfortably Eyes: Pupils equal, round; sclera anicteric; moist conjunctiva; no lid lag HENT: Oral mucosa moist; good dentition Neck: No masses palpated, trachea midline; no thyromegaly Lungs: CTA bilaterally; normal respiratory effort Breasts: Symmetric, no nipple changes or discharge. No axillary or supraclavicular lymphadenopathy. Bilateral fibrocystic changes. No dominant masses. Healed surgical scar in the upper right breast. CV: Regular rate and rhythm; no murmurs; extremities well-perfused with no edema Abd: +bowel sounds, soft, non-tender, no palpable organomegaly; no palpable hernias Musc: Normal gait; no apparent clubbing or cyanosis in extremities Lymphatic: No palpable cervical or axillary lymphadenopathy Skin: Warm, dry; no sign of jaundice Psychiatric - alert and oriented x 4; calm mood and affect    Assessment & Plan Crystal Key K. [name] MD; 03/09/2020 9:25 AM)  Crystal Buchanan PAPILLOMA OF BREAST, LEFT (D24.2)  Current Plans Schedule for Buchanan - Left radioactive seed localized lumpectomy. The surgical procedure has been discussed with the patient. Potential risks, benefits, alternative treatments, and expected outcomes have been explained. All of the patient's questions at this time have been answered. The likelihood of reaching the patient's treatment goal is good. The patient understand the proposed surgical procedure and wishes to proceed.  Crystal Buchanan. Crystal Dover, MD,  Crystal Buchanan  General/ Trauma Buchanan   04/18/2020 8:24 AM

## 2020-04-18 NOTE — Anesthesia Procedure Notes (Signed)
Procedure Name: LMA Insertion Date/Time: 04/18/2020 9:39 AM Performed by: Maryella Shivers, CRNA Pre-anesthesia Checklist: Patient identified, Emergency Drugs available, Suction available and Patient being monitored Patient Re-evaluated:Patient Re-evaluated prior to induction Oxygen Delivery Method: Circle system utilized Preoxygenation: Pre-oxygenation with 100% oxygen Induction Type: IV induction Ventilation: Mask ventilation without difficulty LMA: LMA inserted LMA Size: 4.0 Number of attempts: 1 Airway Equipment and Method: Bite block Placement Confirmation: positive ETCO2 Tube secured with: Tape Dental Injury: Teeth and Oropharynx as per pre-operative assessment

## 2020-04-19 ENCOUNTER — Ambulatory Visit: Payer: Medicare Other

## 2020-04-19 ENCOUNTER — Encounter (HOSPITAL_BASED_OUTPATIENT_CLINIC_OR_DEPARTMENT_OTHER): Payer: Self-pay | Admitting: Surgery

## 2020-04-19 NOTE — Progress Notes (Signed)
error 

## 2020-04-20 LAB — SURGICAL PATHOLOGY

## 2020-04-24 ENCOUNTER — Ambulatory Visit: Payer: Medicare Other

## 2020-04-24 DIAGNOSIS — K219 Gastro-esophageal reflux disease without esophagitis: Secondary | ICD-10-CM

## 2020-04-24 DIAGNOSIS — F418 Other specified anxiety disorders: Secondary | ICD-10-CM

## 2020-04-24 NOTE — Progress Notes (Signed)
[facility]  Name: Crystal Buchanan   MRN: [id number]  DOB: 05-06-1961  Chief Complaint/HPI Crystal Buchanan,  59 y.o. , female presents for their Follow-Up CCM visit with the clinical pharmacist via telephone due to COVID-19 Pandemic.   Underwent left breast lumpectomy last week and today found out that biopsy was negative for cancer. Reports mood/energy has been good overall but still feeling down a few times a week. Denies any side effects after restarting sertraline 25 mg daily. Is not interested in starting counseling or increasing sertraline dose at this time.   Was given Vicodin following lumpectomy. Has used just one pill following surgery and has been able to manage pain without it. Reports voltaren has significantly improved arthritis related pain.   Exercise - continues doing chair exercises, laps throughout house, and stationary bike several times per week. Continues to limit sweets in her diet. Reports current weight 184 lbs with goal of getting to 180 lbs.   PCP : Crystal Buchanan, Crystal Buchanan  Their chronic conditions include: depression, HLD, DM and GERD  Office Vistis: 02/01/2020 (PCP): a1c further improved to 6.4 from 6.6 01/23/2020 [facility]): Had stopped sertraline due to concern of stomach pain, restarted at 12.5 mg daily x 2 weeks then 25 mg daily if tolerated. GERD had improved. Had just lost 12 lbs by 'cutting out chocolate'. Had stopped using her protonix due to no symptoms, occasional tums needed. Discussed voltaren for back pain     Consult Visits:  04/18/2020 (Surgery): Left breast lumpectomy; surgery f.u with Crystal Buchanan 05/09/2020  Outpatient Encounter Medications as of 04/24/2020  Medication Sig  . ALPRAZolam (XANAX) 0.5 MG tablet Take 1 tablet (0.5 mg total) by mouth 2 (two) times daily as needed for anxiety.  . diclofenac sodium (VOLTAREN) 1 % GEL Apply 2 g topically 4 (four) times daily. (Patient taking differently: Apply 4 g topically 4 (four)  times daily. )  . HYDROcodone-acetaminophen (NORCO/VICODIN) 5-325 MG tablet Take 1 tablet by mouth every 6 (six) hours as needed for moderate pain.  . metFORMIN (GLUCOPHAGE) 500 MG tablet TAKE 1 TABLET BY MOUTH TWICE DAILY WITH A MEAL  . methocarbamol (ROBAXIN) 500 MG tablet Take 500 mg by mouth daily.   . Multiple Vitamin (MULTIVITAMIN) LIQD Take 5 mLs by mouth daily.   . pantoprazole (PROTONIX) 40 MG tablet Take 1 tablet (40 mg total) by mouth daily.  . rosuvastatin (CRESTOR) 20 MG tablet Take 1 tablet (20 mg total) by mouth daily.  . sertraline (ZOLOFT) 25 MG tablet Take 1 tablet (25 mg total) by mouth daily.  . SUMAtriptan (IMITREX) 50 MG tablet sumatriptan 50 mg tablet  TAKE 1 TABLT BY MOUTH ONCE FOR 1 DOSE MAY REPEAT IN 2 HOURS IF HEADACHE PERSISTS OR RECURS  . tiZANidine (ZANAFLEX) 2 MG tablet Take 1 tablet (2 mg total) by mouth every 6 (six) hours as needed.  . SUMAtriptan (IMITREX) 50 MG tablet Take 1 tablet (50 mg total) by mouth once for 1 dose. May repeat in 2 hours if headache persists or recurs.   No facility-administered encounter medications on file as of 04/24/2020.   Current Diagnosis/Assessment:  Goals Addressed            This Visit's Progress   . PharmD Care Plan       CARE PLAN ENTRY  Current Barriers:  . Chronic Disease Management support, education, and care coordination needs related to depression, GERD, high cholesterol and type 2 diabetes  Pharmacist  Clinical Goal(s):  Marland Kitchen LDL <100 . Alc <7% . Minimize symptoms of depression . Minimize symptoms of acid reflux  Interventions: . Comprehensive medication review performed. . Start pantoprazole 40 mg at the same time every forming . Please reach out to the office if you feel mood/energy has not improved so we can discuss options including counseling or sertraline dose increase  Patient Self Care Activities:  . Patient verbalizes understanding of plan to medication changes above. Please call me at [phone] with any questions. Marland Kitchen Keep up the good work with the diet and weight loss!  Initial goal documentation.       GERD   Ongoing acid reflux several times per week. Since last visit had started taking pantoprazole prn after previously not needing. Patient expresses understanding of plan to take pantoprazole first thing every morning on consistent basis to see if improvement in symptoms noted. Currently uncontrolled on:  Pantoprazole 40 mg once daily   We discussed: review diet recommendations, counseled on pantoprazole use.   Plan Start pantoprazole 40 mg every day.   Depression   PHQ 2/9: 1. Denies any side effects after restarting sertraline. Is not interested in receiving counseling or dose increase at this time but states may consider at future visit. Currently controlled on:   Sertraline 25 mg once daily  We discussed: pharmacologic and behavioral therapy for depression.    Plan  Continue current medication.   Hyperlipidemia   LDL goal < 100     Component Value Date/Time   CHOL 130 02/02/2020 1303   TRIG 77.0 02/02/2020 1303   HDL 53.40 02/02/2020 1303   CHOLHDL 2 02/02/2020 1303   VLDL 15.4 02/02/2020 1303   LDLCALC 61 02/02/2020 1303   LDLDIRECT 213.0 08/04/2016 1158    The 10-year ASCVD risk score (Goff DC Jr., et al., 2013) is: 9.7%   Values used to calculate the score:     Age: 9 years     Sex: Female     Is Non-Hispanic African American: Yes     Diabetic: Yes     Tobacco smoker: No     Systolic Blood Pressure: 570 mmHg     Is BP treated: No     HDL Cholesterol: 53.4 mg/dL     Total Cholesterol: 130 mg/dL   Denies any muscle/abd pain or n/v at this time.  Patient is currently controlled on the following medications:   Crestor 20 mg daily  Plan Continue current medications and current diet.   Diabetes   a1c goal <7%   Recent Relevant Labs: Lab Results  Component Value Date/Time   HGBA1C 6.4 02/02/2020 01:03 PM   HGBA1C 6.6 (H)  03/02/2019 09:24 AM   MICROALBUR 17.0 (H) 02/02/2020 01:03 PM   MICROALBUR 9.4 (H) 03/22/2018 11:15 AM   ACR 14 01/2020. A1c improved to 6.4% 01/2020. Checking BG: Rarely.   a1c at goal. Denies any side effects at this time, including upset stomach or diarrhea. Patient is currently controlled on the following medications:   Metformin 500 mg twice daily with a meal.    We discussed: diet and exercise extensively.   Plan  Continue current medications.   Arthritis Pain   Last [facility] visit, pt asked about OTC options for arthritis pain. Reports significant improvement in pain after trying otc voltaren gel. Patient is currently controlled on the following medications:  Marland Kitchen Voltaren 1% topical gel 4 mg four times daily  Plan Start topical Voltaren and continue current medications.  Follow-up: 3 month telephone visit  Depression, GERD ____________  Visit Information  SDOH performed: Yes.  [name], Pharm.D., BCGP Clinical Pharmacist [id number] [phone]

## 2020-04-24 NOTE — Patient Instructions (Addendum)
Please call me at [phone] (direct line) with any questions - thank you!  - Edyth Gunnels., Clinical Pharmacist  Goals Addressed            This Visit's Progress   . PharmD Care Plan       CARE PLAN ENTRY  Current Barriers:  . Chronic Disease Management support, education, and care coordination needs related to depression, GERD, high cholesterol and type 2 diabetes  Pharmacist Clinical Goal(s):  Marland Kitchen LDL <100 . Alc <7% . Minimize symptoms of depression . Minimize symptoms of acid reflux  Interventions: . Comprehensive medication review performed. . Start pantoprazole 40 mg at the same time every forming . Please reach out to the office if you feel mood/energy has not improved so we can discuss options including counseling or sertraline dose increase  Patient Self Care Activities:  . Patient verbalizes understanding of plan to medication changes above. Please call me at [phone] with any questions. Marland Kitchen Keep up the good work with the diet and weight loss!  Initial goal documentation.     The patient verbalized understanding of instructions provided today and agreed to receive a mailed copy of patient instruction and/or educational materials. Telephone follow up appointment with pharmacy team member scheduled for: See next appointment with "Care Management Staff" under "What's Next" below.   Thank you,  [name], Pharm.D., BCGP Clinical Pharmacist [facility] [phone]  Food Choices for Gastroesophageal Reflux Disease, Adult When you have gastroesophageal reflux disease (GERD), the foods you eat and your eating habits are very important. Choosing the right foods can help ease your discomfort. Think about working with a nutrition specialist (dietitian) to help you make good choices. What are tips for following this plan?  Meals  Choose healthy foods that are low in fat, such as fruits, vegetables, whole grains, low-fat dairy products, and lean meat, fish, and  poultry.  Eat small meals often instead of 3 large meals a day. Eat your meals slowly, and in a place where you are relaxed. Avoid bending over or lying down until 2-3 hours after eating.  Avoid eating meals 2-3 hours before bed.  Avoid drinking a lot of liquid with meals.  Cook foods using methods other than frying. Bake, grill, or broil food instead.  Avoid or limit: ? Chocolate. ? Peppermint or spearmint. ? Alcohol. ? Pepper. ? Black and decaffeinated coffee. ? Black and decaffeinated tea. ? Bubbly (carbonated) soft drinks. ? Caffeinated energy drinks and soft drinks.  Limit high-fat foods such as: ? Fatty meat or fried foods. ? Whole milk, cream, butter, or ice cream. ? Nuts and nut butters. ? Pastries, donuts, and sweets made with butter or shortening.  Avoid foods that cause symptoms. These foods may be different for everyone. Common foods that cause symptoms include: ? Tomatoes. ? Oranges, lemons, and limes. ? Peppers. ? Spicy food. ? Onions and garlic. ? Vinegar. Lifestyle  Maintain a healthy weight. Ask your doctor what weight is healthy for you. If you need to lose weight, work with your doctor to do so safely.  Exercise for at least 30 minutes for 5 or more days each week, or as told by your doctor.  Wear loose-fitting clothes.  Do not smoke. If you need help quitting, ask your doctor.  Sleep with the head of your bed higher than your feet. Use a wedge under the mattress or blocks under the bed frame to raise the head of the bed. Summary  When you have  gastroesophageal reflux disease (GERD), food and lifestyle choices are very important in easing your symptoms.  Eat small meals often instead of 3 large meals a day. Eat your meals slowly, and in a place where you are relaxed.  Limit high-fat foods such as fatty meat or fried foods.  Avoid bending over or lying down until 2-3 hours after eating.  Avoid peppermint and spearmint, caffeine, alcohol, and  chocolate. This information is not intended to replace advice given to you by your health care provider. Make sure you discuss any questions you have with your health care provider. Document Revised: 01/27/2019 Document Reviewed: 11/11/2016 Elsevier Patient Education  2020 [place] With Depression Everyone experiences occasional disappointment, sadness, and loss in their lives. When you are feeling down, blue, or sad for at least 2 weeks in a row, it may mean that you have depression. Depression can affect your thoughts and feelings, relationships, daily activities, and physical health. It is caused by changes in the way your brain functions. If you receive a diagnosis of depression, your health care provider will tell you which type of depression you have and what treatment options are available to you. If you are living with depression, there are ways to help you recover from it and also ways to prevent it from coming back. How to cope with lifestyle changes Coping with stress     Stress is your body's reaction to life changes and events, both good and bad. Stressful situations may include:  Getting married.  The death of a spouse.  Losing a job.  Retiring.  Having a baby. Stress can last just a few hours or it can be ongoing. Stress can play a major role in depression, so it is important to learn both how to cope with stress and how to think about it differently. Talk with your health care provider or a counselor if you would like to learn more about stress reduction. He or she may suggest some stress reduction techniques, such as:  Music therapy. This can include creating music or listening to music. Choose music that you enjoy and that inspires you.  Mindfulness-based meditation. This kind of meditation can be done while sitting or walking. It involves being aware of your normal breaths, rather than trying to control your breathing.  Centering prayer. This is a kind  of meditation that involves focusing on a spiritual word or phrase. Choose a word, phrase, or sacred image that is meaningful to you and that brings you peace.  Deep breathing. To do this, expand your stomach and inhale slowly through your nose. Hold your breath for 3-5 seconds, then exhale slowly, allowing your stomach muscles to relax.  Muscle relaxation. This involves intentionally tensing muscles then relaxing them. Choose a stress reduction technique that fits your lifestyle and personality. Stress reduction techniques take time and practice to develop. Set aside 5-15 minutes a day to do them. Therapists can offer training in these techniques. The training may be covered by some insurance plans. Other things you can do to manage stress include:  Keeping a stress diary. This can help you learn what triggers your stress and ways to control your response.  Understanding what your limits are and saying no to requests or events that lead to a schedule that is too full.  Thinking about how you respond to certain situations. You may not be able to control everything, but you can control how you react.  Adding humor to your life by  watching funny films or TV shows.  Making time for activities that help you relax and not feeling guilty about spending your time this way.  Medicines Your health care provider may suggest certain medicines if he or she feels that they will help improve your condition. Avoid using alcohol and other substances that may prevent your medicines from working properly (may interact). It is also important to:  Talk with your pharmacist or health care provider about all the medicines that you take, their possible side effects, and what medicines are safe to take together.  Make it your goal to take part in all treatment decisions (shared decision-making). This includes giving input on the side effects of medicines. It is best if shared decision-making with your health care  provider is part of your total treatment plan. If your health care provider prescribes a medicine, you may not notice the full benefits of it for 4-8 weeks. Most people who are treated for depression need to be on medicine for at least 6-12 months after they feel better. If you are taking medicines as part of your treatment, do not stop taking medicines without first talking to your health care provider. You may need to have the medicine slowly decreased (tapered) over time to decrease the risk of harmful side effects. Relationships Your health care provider may suggest family therapy along with individual therapy and drug therapy. While there may not be family problems that are causing you to feel depressed, it is still important to make sure your family learns as much as they can about your mental health. Having your family's support can help make your treatment successful. How to recognize changes in your condition Everyone has a different response to treatment for depression. Recovery from major depression happens when you have not had signs of major depression for two months. This may mean that you will start to:  Have more interest in doing activities.  Feel less hopeless than you did 2 months ago.  Have more energy.  Overeat less often, or have better or improving appetite.  Have better concentration. Your health care provider will work with you to decide the next steps in your recovery. It is also important to recognize when your condition is getting worse. Watch for these signs:  Having fatigue or low energy.  Eating too much or too little.  Sleeping too much or too little.  Feeling restless, agitated, or hopeless.  Having trouble concentrating or making decisions.  Having unexplained physical complaints.  Feeling irritable, angry, or aggressive. Get help as soon as you or your family members notice these symptoms coming back. How to get support and help from others How to  talk with friends and family members about your condition  Talking to friends and family members about your condition can provide you with one way to get support and guidance. Reach out to trusted friends or family members, explain your symptoms to them, and let them know that you are working with a health care provider to treat your depression. Financial resources Not all insurance plans cover mental health care, so it is important to check with your insurance carrier. If paying for co-pays or counseling services is a problem, search for a local or county mental health care center. They may be able to offer public mental health care services at low or no cost when you are not able to see a private health care provider. If you are taking medicine for depression, you may be able to  get the generic form, which may be less expensive. Some makers of prescription medicines also offer help to patients who cannot afford the medicines they need. Follow these instructions at home:   Get the right amount and quality of sleep.  Cut down on using caffeine, tobacco, alcohol, and other potentially harmful substances.  Try to exercise, such as walking or lifting small weights.  Take over-the-counter and prescription medicines only as told by your health care provider.  Eat a healthy diet that includes plenty of vegetables, fruits, whole grains, low-fat dairy products, and lean protein. Do not eat a lot of foods that are high in solid fats, added sugars, or salt.  Keep all follow-up visits as told by your health care provider. This is important. Contact a health care provider if:  You stop taking your antidepressant medicines, and you have any of these symptoms: ? Nausea. ? Headache. ? Feeling lightheaded. ? Chills and body aches. ? Not being able to sleep (insomnia).  You or your friends and family think your depression is getting worse. Get help right away if:  You have thoughts of hurting yourself  or others. If you ever feel like you may hurt yourself or others, or have thoughts about taking your own life, get help right away. You can go to your nearest emergency department or call:  Your local emergency services (911 in the [place]  A suicide crisis helpline, such as the [facility] at 725-362-9948. This is open 24-hours a day. Summary  If you are living with depression, there are ways to help you recover from it and also ways to prevent it from coming back.  Work with your health care team to create a management plan that includes counseling, stress management techniques, and healthy lifestyle habits. This information is not intended to replace advice given to you by your health care provider. Make sure you discuss any questions you have with your health care provider. Document Revised: 01/28/2019 Document Reviewed: 09/08/2016 Elsevier Patient Education  [place].

## 2020-05-28 ENCOUNTER — Telehealth: Payer: Self-pay | Admitting: Family Medicine

## 2020-05-28 NOTE — Telephone Encounter (Signed)
   SF 05/28/2020   Name: Crystal Buchanan   MRN: [id number]   DOB: Jun 23, 1961   AGE: 59 y.o.   GENDER: female   PCP [name], MD.   Called pt regarding Community Resource Referral for transportation. Patient stated that she needs transportation to and from her doctors appointments. Informed patient that Care Guide will research to find transportation resources within the [place] area. Patient stated understanding.   Follow up on: 05/29/2020  [place], Care Management Phone: [phone] Email: [email or web address]

## 2020-05-30 NOTE — Telephone Encounter (Signed)
   SF 05/30/2020   Name: Crystal Buchanan   MRN: [id number]   DOB: 16-Apr-1961   AGE: 59 y.o.   GENDER: female   PCP [name], MD.   Called pt regarding Community Resource Referral for transportation. Informed patient that a potential resource for her could be to complete the SCAT application. Explained that there are two parts with the SCAT application that has to be completed by the patient and the provider. Patient stated understanding and asked that forms be mailed to her. Patient has no additional needs at this time.   Closing referral pending any other needs of patient.    [place], Care Management Phone: [phone] Email: [email or web address]

## 2020-06-05 ENCOUNTER — Telehealth: Payer: Self-pay | Admitting: Family Medicine

## 2020-06-05 NOTE — Telephone Encounter (Signed)
Form completed and placed in basket  

## 2020-06-05 NOTE — Telephone Encounter (Signed)
Called patient - told her forms were ready to be picked up.  Patient states that she will have to wait until her husband is back in town to pick up.  Forms are in cabinet.

## 2020-06-05 NOTE — Telephone Encounter (Signed)
FYI

## 2020-06-05 NOTE — Telephone Encounter (Signed)
Transit authority paperwork given to PCP.

## 2020-06-05 NOTE — Telephone Encounter (Signed)
Form placed in Dr. Tabori's bin up front 

## 2020-06-08 ENCOUNTER — Telehealth: Payer: Self-pay | Admitting: Family Medicine

## 2020-06-08 NOTE — Telephone Encounter (Signed)
..  Medication Refills  Medication: crestor and metformin  Pharmacy: [facility] - [address] [place]  ** Let patient know to contact pharmacy at the end of the day to make sure medication is ready.**  ** Please notify patient to allow 48-72 hours to process.**  ** Encourage patient to contact the pharmacy for refills or they can request refills through [facility]**  Clinical Fills out below:   Last refill:  QTY:  Refill Date:    Other Comments:   Okay for refill?  Please advise.

## 2020-06-11 MED ORDER — ROSUVASTATIN CALCIUM 20 MG PO TABS: 20.0000 mg | ORAL_TABLET | Freq: Every day | ORAL | 0 refills | Status: DC

## 2020-06-11 MED ORDER — METFORMIN HCL 500 MG PO TABS: ORAL_TABLET | ORAL | 0 refills | Status: DC

## 2020-06-11 NOTE — Telephone Encounter (Signed)
She has upcoming October appt- ok to fill meds

## 2020-06-11 NOTE — Telephone Encounter (Signed)
Medication filled to pharmacy as requested.   

## 2020-06-11 NOTE — Telephone Encounter (Signed)
Please advise pt has not been seen since April for DM.

## 2020-06-20 DIAGNOSIS — M5417 Radiculopathy, lumbosacral region: Secondary | ICD-10-CM | POA: Diagnosis not present

## 2020-07-03 DIAGNOSIS — M4807 Spinal stenosis, lumbosacral region: Secondary | ICD-10-CM | POA: Diagnosis not present

## 2020-07-03 DIAGNOSIS — M47816 Spondylosis without myelopathy or radiculopathy, lumbar region: Secondary | ICD-10-CM | POA: Diagnosis not present

## 2020-07-03 DIAGNOSIS — M5417 Radiculopathy, lumbosacral region: Secondary | ICD-10-CM | POA: Diagnosis not present

## 2020-07-30 ENCOUNTER — Other Ambulatory Visit: Payer: Self-pay | Admitting: General Practice

## 2020-07-30 DIAGNOSIS — K219 Gastro-esophageal reflux disease without esophagitis: Secondary | ICD-10-CM

## 2020-07-30 MED ORDER — PANTOPRAZOLE SODIUM 40 MG PO TBEC: 40.0000 mg | DELAYED_RELEASE_TABLET | Freq: Every day | ORAL | 0 refills | Status: AC

## 2020-08-06 ENCOUNTER — Telehealth: Payer: Medicare Other

## 2020-08-08 DIAGNOSIS — M5417 Radiculopathy, lumbosacral region: Secondary | ICD-10-CM | POA: Diagnosis not present

## 2020-08-09 ENCOUNTER — Ambulatory Visit: Payer: Medicare Other

## 2020-08-09 NOTE — Chronic Care Management (AMB) (Signed)
°  Chronic Care Management   Outreach Note   Name: Crystal Buchanan MRN: [id number] DOB: [birth year]  Referred by: [name], MD Reason for referral: Telephone Appointment with [place] Pharmacist, [name].   An unsuccessful telephone outreach was attempted today. The patient was referred to the pharmacist for assistance with care management and care coordination.   Telephone appointment with clinical pharmacist today (08/09/2020) at 11am. If patient immediately returns call, transfer to [phone]. Otherwise, please provide this number so patient can reschedule visit.   [name], Pharm.D., BCGP Clinical Pharmacist [facility] [phone]

## 2020-09-03 ENCOUNTER — Telehealth: Payer: Self-pay

## 2020-09-03 NOTE — Progress Notes (Signed)
Chronic Care Management Pharmacy Assistant   Name: Crystal Buchanan  MRN: [id number] DOB: 05-30-1961  Reason for Encounter:  General Assessment/ Follow-up    PCP : [name], MD  Allergies:   Allergies  Allergen Reactions  . Atorvastatin Nausea Only    Along with stomachaches  . Lyrica [Pregabalin]     Dizziness,blurred vision,increased appetite,h/a  . Codeine Palpitations  . Gabapentin Rash    Itching and nausa    Medications: Outpatient Encounter Medications as of 09/03/2020  Medication Sig  . ALPRAZolam (XANAX) 0.5 MG tablet Take 1 tablet (0.5 mg total) by mouth 2 (two) times daily as needed for anxiety.  . diclofenac sodium (VOLTAREN) 1 % GEL Apply 2 g topically 4 (four) times daily. (Patient taking differently: Apply 4 g topically 4 (four) times daily. )  . HYDROcodone-acetaminophen (NORCO/VICODIN) 5-325 MG tablet Take 1 tablet by mouth every 6 (six) hours as needed for moderate pain.  . metFORMIN (GLUCOPHAGE) 500 MG tablet TAKE 1 TABLET BY MOUTH TWICE DAILY WITH A MEAL  . methocarbamol (ROBAXIN) 500 MG tablet Take 500 mg by mouth daily.   . Multiple Vitamin (MULTIVITAMIN) LIQD Take 5 mLs by mouth daily.   . pantoprazole (PROTONIX) 40 MG tablet Take 1 tablet (40 mg total) by mouth daily. Please call [phone] to schedule a diabetes follow up  . rosuvastatin (CRESTOR) 20 MG tablet Take 1 tablet (20 mg total) by mouth daily.  . sertraline (ZOLOFT) 25 MG tablet Take 1 tablet (25 mg total) by mouth daily.  . SUMAtriptan (IMITREX) 50 MG tablet Take 1 tablet (50 mg total) by mouth once for 1 dose. May repeat in 2 hours if headache persists or recurs.  . SUMAtriptan (IMITREX) 50 MG tablet sumatriptan 50 mg tablet  TAKE 1 TABLT BY MOUTH ONCE FOR 1 DOSE MAY REPEAT IN 2 HOURS IF HEADACHE PERSISTS OR RECURS  . tiZANidine (ZANAFLEX) 2 MG tablet Take 1 tablet (2 mg total) by mouth every 6 (six) hours as needed.   No facility-administered encounter medications on file as of  09/03/2020.    Current Diagnosis: Patient Active Problem List   Diagnosis Date Noted  . Arthritis 02/06/2020  . Neuropathy 02/06/2020  . Sciatica 02/06/2020  . Overweight (BMI 25.0-29.9) 02/01/2020  . Hyperlipidemia associated with type 2 diabetes mellitus ([place]) 02/24/2019  . Bilateral hand pain 07/19/2018  . Migraine 12/09/2017  . Diabetes mellitus without complication ([place]) 12/75/1700  . OSA on CPAP 08/04/2016  . Thyromegaly 08/04/2016  . Depression with anxiety 08/04/2016  . GERD (gastroesophageal reflux disease) 10/28/2013  . Routine general medical examination at a health care facility 02/16/2013  . Chronic radicular lumbar pain 12/01/2012  . Neuropathic pain of foot 12/01/2012  . Anemia, iron deficiency 12/01/2012  . Insomnia 12/01/2012   Have you had any problems recently with your health? Patient states she has pain in her hand, back, leg, and foot. Patient states she was referred to neurosurgeon Dr. Ellene Route and she has an upcoming appointment with pain management.  Have you had any problems with your pharmacy? Patient states she does not have any problems getting medications from the pharmacy at this time.  What issues or side effects are you having with your medications? Patient states she is not having any side effects from her medications at this time.  What would you like me to pass along to jacob potts, CPP for them to help you with? Patient states she does not have anything to pass along  at this time.  What can we do to better care for you? Patient states she is getting great care at this time.  [place] Pharmacist Assistant [phone]  Follow-Up:  Pharmacist Review

## 2020-09-04 ENCOUNTER — Telehealth: Payer: Self-pay

## 2020-09-04 NOTE — Progress Notes (Cosign Needed)
ERROR

## 2020-09-10 DIAGNOSIS — Z23 Encounter for immunization: Secondary | ICD-10-CM | POA: Diagnosis not present

## 2020-09-14 ENCOUNTER — Other Ambulatory Visit: Payer: Self-pay | Admitting: Family Medicine

## 2020-10-01 DIAGNOSIS — Z23 Encounter for immunization: Secondary | ICD-10-CM | POA: Diagnosis not present

## 2020-11-08 ENCOUNTER — Telehealth: Payer: Self-pay

## 2020-11-08 NOTE — Chronic Care Management (AMB) (Signed)
Chronic Care Management Pharmacy Assistant   Name: Crystal Buchanan  MRN: [id number] DOB: 05-Mar-1961  Reason for Encounter: Disease State/General Adherence Call   PCP : [name], MD  Allergies:   Allergies  Allergen Reactions  . Atorvastatin Nausea Only    Along with stomachaches  . Lyrica [Pregabalin]     Dizziness,blurred vision,increased appetite,h/a  . Codeine Palpitations  . Gabapentin Rash    Itching and nausa    Medications: Outpatient Encounter Medications as of 11/08/2020  Medication Sig  . ALPRAZolam (XANAX) 0.5 MG tablet Take 1 tablet (0.5 mg total) by mouth 2 (two) times daily as needed for anxiety.  . diclofenac sodium (VOLTAREN) 1 % GEL Apply 2 g topically 4 (four) times daily. (Patient taking differently: Apply 4 g topically 4 (four) times daily. )  . HYDROcodone-acetaminophen (NORCO/VICODIN) 5-325 MG tablet Take 1 tablet by mouth every 6 (six) hours as needed for moderate pain.  . metFORMIN (GLUCOPHAGE) 500 MG tablet TAKE 1 TABLET BY MOUTH TWICE DAILY WITH A MEAL  . methocarbamol (ROBAXIN) 500 MG tablet Take 500 mg by mouth daily.   . Multiple Vitamin (MULTIVITAMIN) LIQD Take 5 mLs by mouth daily.   . pantoprazole (PROTONIX) 40 MG tablet Take 1 tablet (40 mg total) by mouth daily. Please call [phone] to schedule a diabetes follow up  . rosuvastatin (CRESTOR) 20 MG tablet Take 1 tablet by mouth once daily  . sertraline (ZOLOFT) 25 MG tablet Take 1 tablet (25 mg total) by mouth daily.  . SUMAtriptan (IMITREX) 50 MG tablet Take 1 tablet (50 mg total) by mouth once for 1 dose. May repeat in 2 hours if headache persists or recurs.  . SUMAtriptan (IMITREX) 50 MG tablet sumatriptan 50 mg tablet  TAKE 1 TABLT BY MOUTH ONCE FOR 1 DOSE MAY REPEAT IN 2 HOURS IF HEADACHE PERSISTS OR RECURS  . tiZANidine (ZANAFLEX) 2 MG tablet Take 1 tablet (2 mg total) by mouth every 6 (six) hours as needed.   No facility-administered encounter medications on file as of  11/08/2020.    Current Diagnosis: Patient Active Problem List   Diagnosis Date Noted  . Arthritis 02/06/2020  . Neuropathy 02/06/2020  . Sciatica 02/06/2020  . Overweight (BMI 25.0-29.9) 02/01/2020  . Hyperlipidemia associated with type 2 diabetes mellitus ([place]) 02/24/2019  . Bilateral hand pain 07/19/2018  . Migraine 12/09/2017  . Diabetes mellitus without complication ([place]) 93/71/6967  . OSA on CPAP 08/04/2016  . Thyromegaly 08/04/2016  . Depression with anxiety 08/04/2016  . GERD (gastroesophageal reflux disease) 10/28/2013  . Routine general medical examination at a health care facility 02/16/2013  . Chronic radicular lumbar pain 12/01/2012  . Neuropathic pain of foot 12/01/2012  . Anemia, iron deficiency 12/01/2012  . Insomnia 12/01/2012    Have you seen any other providers since your last visit with [name], Pharm.D., BCGP?  Patient has not seen any other providers since her last visit with [name], Pharm.D., BCGP.  Have you had any problems recently with your health?  Patient states she has a lot of back pain. She states she is unable to walk a certain distance without being in pain.  Have you had any problems with your pharmacy?  Patient states she does not have any problems with her pharmacy. Patient states she is out of Sertraline and requests for me to request refills from Dr. [name]. (called [facility] on [address] and requested refills for the patient).  What issues or  side effects are you having with your medications?  Patient states she is not having any issues or side effects from any of her medications.  What would you like me to pass along to [name], [place].D., BCGP for them to help you with?   Patient states she is feeling down and sad as her mother recently passed away in 2020-10-09.  What can we do to take care of you better?  Patient states she is doing well at this time.  April D Calhoun, [place] Pharmacist  Assistant [phone]   Follow-Up:  Pharmacist Review

## 2020-12-06 ENCOUNTER — Telehealth: Payer: Self-pay

## 2020-12-06 NOTE — Chronic Care Management (AMB) (Signed)
    Chronic Care Management Pharmacy Assistant   Name: BREIANNA DELFINO  MRN: [id number] DOB: 1961-06-06  Reason for Encounter: Schedule Follow-Up With Clinical Pharmacist  Attempted to reach patient to schedule her follow-up telephone appointment with clinical pharmacist [name], CPP. I left a message for the patient to return my call at [phone].  April D Calhoun, [place] Pharmacist Assistant [phone]   Follow-Up:  Pharmacist Review

## 2020-12-13 ENCOUNTER — Telehealth: Payer: Self-pay

## 2020-12-13 NOTE — Chronic Care Management (AMB) (Signed)
    Chronic Care Management Pharmacy Assistant   Name: Crystal Buchanan  MRN: [id number] DOB: August 26, 1961  Reason for Encounter: Chart Review  PCP : [name], MD  Allergies:   Allergies  Allergen Reactions  . Atorvastatin Nausea Only    Along with stomachaches  . Lyrica [Pregabalin]     Dizziness,blurred vision,increased appetite,h/a  . Codeine Palpitations  . Gabapentin Rash    Itching and nausa    Medications: Outpatient Encounter Medications as of 12/13/2020  Medication Sig  . ALPRAZolam (XANAX) 0.5 MG tablet Take 1 tablet (0.5 mg total) by mouth 2 (two) times daily as needed for anxiety.  . diclofenac sodium (VOLTAREN) 1 % GEL Apply 2 g topically 4 (four) times daily. (Patient taking differently: Apply 4 g topically 4 (four) times daily. )  . HYDROcodone-acetaminophen (NORCO/VICODIN) 5-325 MG tablet Take 1 tablet by mouth every 6 (six) hours as needed for moderate pain.  . metFORMIN (GLUCOPHAGE) 500 MG tablet TAKE 1 TABLET BY MOUTH TWICE DAILY WITH A MEAL  . methocarbamol (ROBAXIN) 500 MG tablet Take 500 mg by mouth daily.   . Multiple Vitamin (MULTIVITAMIN) LIQD Take 5 mLs by mouth daily.   . pantoprazole (PROTONIX) 40 MG tablet Take 1 tablet (40 mg total) by mouth daily. Please call [phone] to schedule a diabetes follow up  . rosuvastatin (CRESTOR) 20 MG tablet Take 1 tablet by mouth once daily  . sertraline (ZOLOFT) 25 MG tablet Take 1 tablet (25 mg total) by mouth daily.  . SUMAtriptan (IMITREX) 50 MG tablet Take 1 tablet (50 mg total) by mouth once for 1 dose. May repeat in 2 hours if headache persists or recurs.  . SUMAtriptan (IMITREX) 50 MG tablet sumatriptan 50 mg tablet  TAKE 1 TABLT BY MOUTH ONCE FOR 1 DOSE MAY REPEAT IN 2 HOURS IF HEADACHE PERSISTS OR RECURS  . tiZANidine (ZANAFLEX) 2 MG tablet Take 1 tablet (2 mg total) by mouth every 6 (six) hours as needed.   No facility-administered encounter medications on file as of 12/13/2020.    Current  Diagnosis: Patient Active Problem List   Diagnosis Date Noted  . Arthritis 02/06/2020  . Neuropathy 02/06/2020  . Sciatica 02/06/2020  . Overweight (BMI 25.0-29.9) 02/01/2020  . Hyperlipidemia associated with type 2 diabetes mellitus ([place]) 02/24/2019  . Bilateral hand pain 07/19/2018  . Migraine 12/09/2017  . Diabetes mellitus without complication ([place]) 47/82/9562  . OSA on CPAP 08/04/2016  . Thyromegaly 08/04/2016  . Depression with anxiety 08/04/2016  . GERD (gastroesophageal reflux disease) 10/28/2013  . Routine general medical examination at a health care facility 02/16/2013  . Chronic radicular lumbar pain 12/01/2012  . Neuropathic pain of foot 12/01/2012  . Anemia, iron deficiency 12/01/2012  . Insomnia 12/01/2012    Reviewed chart for medication changes. No OVs, Consults, or hospital visits since last care coordination call/Pharmacist visit. No medication changes indicated.  Patient is due for a follow up telephone visit with Clinical Pharmacist, attempt made to reach the patient. I left a message for the patient to return my call.  April D Calhoun, [place] Pharmacist Assistant [phone]   Follow-Up:  Pharmacist Review

## 2020-12-16 ENCOUNTER — Other Ambulatory Visit: Payer: Self-pay | Admitting: Family Medicine

## 2021-03-06 ENCOUNTER — Telehealth: Payer: Self-pay

## 2021-03-06 NOTE — Chronic Care Management (AMB) (Signed)
    Chronic Care Management Pharmacy Assistant   Name: Crystal Buchanan  MRN: [id number] DOB: January 28, 1961  Reason for Encounter: General Adherence Call  Recent office visits:  No visits noted  Recent consult visits:  No visits noted  Hospital visits:  None in previous 6 months  Medications: Outpatient Encounter Medications as of 03/06/2021  Medication Sig  . ALPRAZolam (XANAX) 0.5 MG tablet Take 1 tablet (0.5 mg total) by mouth 2 (two) times daily as needed for anxiety.  . diclofenac sodium (VOLTAREN) 1 % GEL Apply 2 g topically 4 (four) times daily. (Patient taking differently: Apply 4 g topically 4 (four) times daily. )  . HYDROcodone-acetaminophen (NORCO/VICODIN) 5-325 MG tablet Take 1 tablet by mouth every 6 (six) hours as needed for moderate pain.  . metFORMIN (GLUCOPHAGE) 500 MG tablet TAKE 1 TABLET BY MOUTH TWICE DAILY WITH A MEAL  . methocarbamol (ROBAXIN) 500 MG tablet Take 500 mg by mouth daily.   . Multiple Vitamin (MULTIVITAMIN) LIQD Take 5 mLs by mouth daily.   . pantoprazole (PROTONIX) 40 MG tablet Take 1 tablet (40 mg total) by mouth daily. Please call [phone] to schedule a diabetes follow up  . rosuvastatin (CRESTOR) 20 MG tablet Take 1 tablet by mouth once daily  . sertraline (ZOLOFT) 25 MG tablet Take 1 tablet (25 mg total) by mouth daily.  . SUMAtriptan (IMITREX) 50 MG tablet Take 1 tablet (50 mg total) by mouth once for 1 dose. May repeat in 2 hours if headache persists or recurs.  . SUMAtriptan (IMITREX) 50 MG tablet sumatriptan 50 mg tablet  TAKE 1 TABLT BY MOUTH ONCE FOR 1 DOSE MAY REPEAT IN 2 HOURS IF HEADACHE PERSISTS OR RECURS  . tiZANidine (ZANAFLEX) 2 MG tablet Take 1 tablet (2 mg total) by mouth every 6 (six) hours as needed.   No facility-administered encounter medications on file as of 03/06/2021.    Have you had any problems recently with your health?  Have you had any problems with your pharmacy?  What issues or side effects are you having with  your medications?  What would you like me to pass along to Edison Nasuti Potts,CPP for them to help you with?   What can we do to take care of you better?  Star Rating Drugs: Rosuvastatin 20 mg  Several unsuccessful attempts made to contact patient   Wilford Sports CPA, CMA

## 2021-03-28 ENCOUNTER — Telehealth: Payer: Self-pay

## 2021-03-28 NOTE — Chronic Care Management (AMB) (Signed)
    Chronic Care Management Pharmacy Assistant   Name: UNITA DETAMORE  MRN: [id number] DOB: 1961-02-26  Reason for Encounter: General Adherence call   Recent office visits:  None  Recent consult visits:  None  Hospital visits:  None in previous 6 months  Medications: Outpatient Encounter Medications as of 03/28/2021  Medication Sig   ALPRAZolam (XANAX) 0.5 MG tablet Take 1 tablet (0.5 mg total) by mouth 2 (two) times daily as needed for anxiety.   diclofenac sodium (VOLTAREN) 1 % GEL Apply 2 g topically 4 (four) times daily. (Patient taking differently: Apply 4 g topically 4 (four) times daily. )   HYDROcodone-acetaminophen (NORCO/VICODIN) 5-325 MG tablet Take 1 tablet by mouth every 6 (six) hours as needed for moderate pain.   metFORMIN (GLUCOPHAGE) 500 MG tablet TAKE 1 TABLET BY MOUTH TWICE DAILY WITH A MEAL   methocarbamol (ROBAXIN) 500 MG tablet Take 500 mg by mouth daily.    Multiple Vitamin (MULTIVITAMIN) LIQD Take 5 mLs by mouth daily.    pantoprazole (PROTONIX) 40 MG tablet Take 1 tablet (40 mg total) by mouth daily. Please call [phone] to schedule a diabetes follow up   rosuvastatin (CRESTOR) 20 MG tablet Take 1 tablet by mouth once daily   sertraline (ZOLOFT) 25 MG tablet Take 1 tablet (25 mg total) by mouth daily.   SUMAtriptan (IMITREX) 50 MG tablet Take 1 tablet (50 mg total) by mouth once for 1 dose. May repeat in 2 hours if headache persists or recurs.   SUMAtriptan (IMITREX) 50 MG tablet sumatriptan 50 mg tablet  TAKE 1 TABLT BY MOUTH ONCE FOR 1 DOSE MAY REPEAT IN 2 HOURS IF HEADACHE PERSISTS OR RECURS   tiZANidine (ZANAFLEX) 2 MG tablet Take 1 tablet (2 mg total) by mouth every 6 (six) hours as needed.   No facility-administered encounter medications on file as of 03/28/2021.   Have you had any problems recently with your health?  Have you had any problems with your pharmacy?  What issues or side effects are you having with your medications?  What would you like  me to pass along to Edison Nasuti Potts,CPP for them to help you with?   What can we do to take care of you better?  Star Rating Drugs:  Metformin 500mg   Last filled 09/17/20  90 DS Rosuvastatin 20mg   Last filled 09/17/20  90 DS  *Several unsuccessful attempts to reach patient.  Patients phone numbers not in service.  Patients husband and son did not call back.   [place]

## 2021-04-29 ENCOUNTER — Encounter (HOSPITAL_COMMUNITY): Payer: Self-pay

## 2021-05-23 ENCOUNTER — Telehealth: Payer: Self-pay

## 2021-05-23 NOTE — Progress Notes (Signed)
    Chronic Care Management Pharmacy Assistant   Name: Crystal Buchanan  MRN: [id number] DOB: [birth year]   Reason for Encounter: Disease State - General Adherence   Recent office visits:  None noted.   Recent consult visits:  None noted.   Hospital visits:  None in previous 6 months  Medications: Outpatient Encounter Medications as of 05/23/2021  Medication Sig   ALPRAZolam (XANAX) 0.5 MG tablet Take 1 tablet (0.5 mg total) by mouth 2 (two) times daily as needed for anxiety.   diclofenac sodium (VOLTAREN) 1 % GEL Apply 2 g topically 4 (four) times daily. (Patient taking differently: Apply 4 g topically 4 (four) times daily. )   HYDROcodone-acetaminophen (NORCO/VICODIN) 5-325 MG tablet Take 1 tablet by mouth every 6 (six) hours as needed for moderate pain.   metFORMIN (GLUCOPHAGE) 500 MG tablet TAKE 1 TABLET BY MOUTH TWICE DAILY WITH A MEAL   methocarbamol (ROBAXIN) 500 MG tablet Take 500 mg by mouth daily.    Multiple Vitamin (MULTIVITAMIN) LIQD Take 5 mLs by mouth daily.    pantoprazole (PROTONIX) 40 MG tablet Take 1 tablet (40 mg total) by mouth daily. Please call [phone] to schedule a diabetes follow up   rosuvastatin (CRESTOR) 20 MG tablet Take 1 tablet by mouth once daily   sertraline (ZOLOFT) 25 MG tablet Take 1 tablet (25 mg total) by mouth daily.   SUMAtriptan (IMITREX) 50 MG tablet Take 1 tablet (50 mg total) by mouth once for 1 dose. May repeat in 2 hours if headache persists or recurs.   SUMAtriptan (IMITREX) 50 MG tablet sumatriptan 50 mg tablet  TAKE 1 TABLT BY MOUTH ONCE FOR 1 DOSE MAY REPEAT IN 2 HOURS IF HEADACHE PERSISTS OR RECURS   tiZANidine (ZANAFLEX) 2 MG tablet Take 1 tablet (2 mg total) by mouth every 6 (six) hours as needed.   No facility-administered encounter medications on file as of 05/23/2021.   Have you had any problems recently with your health?   Have you had any problems with your pharmacy?   What issues or side effects are you having with your  medications?   What would you like me to pass along to [name], CPP for them to help you with?    What can we do to take care of you better?   Star Rating Drugs:  rosuvastatin (CRESTOR) 20 MG tablet - last filled  04/05/21 90 days metFORMIN (GLUCOPHAGE) 500 MG tablet - last filled 04/05/21 90 days    ** Attempted both numbers on file. Both numbers are not in service. Unable to LM.** 05/27/21  2nd attempt both number are disconnected unable to LM.[place], [place] Clinical Pharmacist Assistant  [phone]  Time Spent: 27 minutes

## 2021-07-26 ENCOUNTER — Telehealth: Payer: Self-pay

## 2021-07-26 NOTE — Progress Notes (Signed)
    Chronic Care Management Pharmacy Assistant   Name: VAIL BASISTA  MRN: [id number] DOB: [birth year]   Reason for Encounter: Disease State - General Adherence Call     Recent office visits:  None noted.  Recent consult visits:  None noted.  Hospital visits:  None in previous 6 months  Medications: Outpatient Encounter Medications as of 07/26/2021  Medication Sig   ALPRAZolam (XANAX) 0.5 MG tablet Take 1 tablet (0.5 mg total) by mouth 2 (two) times daily as needed for anxiety.   diclofenac sodium (VOLTAREN) 1 % GEL Apply 2 g topically 4 (four) times daily. (Patient taking differently: Apply 4 g topically 4 (four) times daily. )   HYDROcodone-acetaminophen (NORCO/VICODIN) 5-325 MG tablet Take 1 tablet by mouth every 6 (six) hours as needed for moderate pain.   metFORMIN (GLUCOPHAGE) 500 MG tablet TAKE 1 TABLET BY MOUTH TWICE DAILY WITH A MEAL   methocarbamol (ROBAXIN) 500 MG tablet Take 500 mg by mouth daily.    Multiple Vitamin (MULTIVITAMIN) LIQD Take 5 mLs by mouth daily.    pantoprazole (PROTONIX) 40 MG tablet Take 1 tablet (40 mg total) by mouth daily. Please call [phone] to schedule a diabetes follow up   rosuvastatin (CRESTOR) 20 MG tablet Take 1 tablet by mouth once daily   sertraline (ZOLOFT) 25 MG tablet Take 1 tablet (25 mg total) by mouth daily.   SUMAtriptan (IMITREX) 50 MG tablet Take 1 tablet (50 mg total) by mouth once for 1 dose. May repeat in 2 hours if headache persists or recurs.   SUMAtriptan (IMITREX) 50 MG tablet sumatriptan 50 mg tablet  TAKE 1 TABLT BY MOUTH ONCE FOR 1 DOSE MAY REPEAT IN 2 HOURS IF HEADACHE PERSISTS OR RECURS   tiZANidine (ZANAFLEX) 2 MG tablet Take 1 tablet (2 mg total) by mouth every 6 (six) hours as needed.   No facility-administered encounter medications on file as of 07/26/2021.    Have you had any problems recently with your health?   Have you had any problems with your pharmacy?   What issues or side effects are you having  with your medications?   What would you like me to pass along to [name], CPP for them to help you with?    What can we do to take care of you better?   Care Gaps  AWV: done 04/03/20  overdue 04/03/21 Colonoscopy:  DM Eye Exam: overdue 02/13/21 DM Foot Exam: overdue 03/23/19 Microalbumin: over due 02/01/21 HbgAIC: over due 08/03/20 DEXA: unknown  Mammogram: due 02/05/22   Star Rating Drugs: Rosuvastatin (CRESTOR) 20 MG tablet - last filled 07/13/21 90 days  MetFORMIN (GLUCOPHAGE) 500 MG tablet - last filled 07/13/21 90 days   No future appointments.  **Both numbers in chart are not in working order. Unable to contact patient. [place], [place] Pharmacist Assistant  [phone]  Time Spent: 20 minutes

## 2021-08-20 ENCOUNTER — Telehealth: Payer: Self-pay

## 2021-08-20 NOTE — Progress Notes (Addendum)
    Chronic Care Management Pharmacy Assistant   Name: Crystal Buchanan  MRN: [id number] DOB: 09-17-1961   Reason for Encounter: Disease State - General Adherence Call     Recent office visits:  None noted.   Recent consult visits:  None noted.   Hospital visits:  None in previous 6 months  Medications: Outpatient Encounter Medications as of 08/20/2021  Medication Sig   ALPRAZolam (XANAX) 0.5 MG tablet Take 1 tablet (0.5 mg total) by mouth 2 (two) times daily as needed for anxiety.   diclofenac sodium (VOLTAREN) 1 % GEL Apply 2 g topically 4 (four) times daily. (Patient taking differently: Apply 4 g topically 4 (four) times daily. )   HYDROcodone-acetaminophen (NORCO/VICODIN) 5-325 MG tablet Take 1 tablet by mouth every 6 (six) hours as needed for moderate pain.   metFORMIN (GLUCOPHAGE) 500 MG tablet TAKE 1 TABLET BY MOUTH TWICE DAILY WITH A MEAL   methocarbamol (ROBAXIN) 500 MG tablet Take 500 mg by mouth daily.    Multiple Vitamin (MULTIVITAMIN) LIQD Take 5 mLs by mouth daily.    pantoprazole (PROTONIX) 40 MG tablet Take 1 tablet (40 mg total) by mouth daily. Please call [phone] to schedule a diabetes follow up   rosuvastatin (CRESTOR) 20 MG tablet Take 1 tablet by mouth once daily   sertraline (ZOLOFT) 25 MG tablet Take 1 tablet (25 mg total) by mouth daily.   SUMAtriptan (IMITREX) 50 MG tablet Take 1 tablet (50 mg total) by mouth once for 1 dose. May repeat in 2 hours if headache persists or recurs.   SUMAtriptan (IMITREX) 50 MG tablet sumatriptan 50 mg tablet  TAKE 1 TABLT BY MOUTH ONCE FOR 1 DOSE MAY REPEAT IN 2 HOURS IF HEADACHE PERSISTS OR RECURS   tiZANidine (ZANAFLEX) 2 MG tablet Take 1 tablet (2 mg total) by mouth every 6 (six) hours as needed.   No facility-administered encounter medications on file as of 08/20/2021.    Have you had any problems recently with your health? Patient denied   Have you had any problems with your pharmacy? Patient denied  What issues or  side effects are you having with your medications? Patient denied   What would you like me to pass along to [name], CPP for them to help you with?  Patient did not have anything to pass along to CPP at this time.   What can we do to take care of you better? Patient did not have any recommendations at this time. She reported she is satisfied with her current level of care.   Care Gaps   AWV: done 04/03/20  overdue 04/03/21 Colonoscopy:  DM Eye Exam: overdue 02/13/21 DM Foot Exam: overdue 03/23/19 Microalbumin: over due 02/01/21 HbgAIC: over due 08/03/20 DEXA: unknown  Mammogram: due 02/05/22   Star Rating Drugs: Rosuvastatin (CRESTOR) 20 MG tablet - last filled 07/13/21 90 days  MetFORMIN (GLUCOPHAGE) 500 MG tablet - last filled 07/13/21 90 days  No future appointments.   Multiple attempts were made to contact patient. Attempts were unsuccessful. / ls,CMA   Jobe Gibbon, [place] Pharmacist Assistant  [phone]  Time Spent: 22 minutes

## 2021-11-29 ENCOUNTER — Telehealth: Payer: Self-pay | Admitting: Pharmacist

## 2021-11-29 NOTE — Progress Notes (Signed)
° ° °  Chronic Care Management Pharmacy Assistant   Name: REMA LIEVANOS  MRN: [id number] DOB: 08-05-1961   Reason for Encounter: Disease State - General Adherence Call    Recent office visits:  None noted  Recent consult visits:  None noted  Hospital visits:  None in previous 6 months  Medications: Outpatient Encounter Medications as of 11/29/2021  Medication Sig   ALPRAZolam (XANAX) 0.5 MG tablet Take 1 tablet (0.5 mg total) by mouth 2 (two) times daily as needed for anxiety.   diclofenac sodium (VOLTAREN) 1 % GEL Apply 2 g topically 4 (four) times daily. (Patient taking differently: Apply 4 g topically 4 (four) times daily. )   HYDROcodone-acetaminophen (NORCO/VICODIN) 5-325 MG tablet Take 1 tablet by mouth every 6 (six) hours as needed for moderate pain.   metFORMIN (GLUCOPHAGE) 500 MG tablet TAKE 1 TABLET BY MOUTH TWICE DAILY WITH A MEAL   methocarbamol (ROBAXIN) 500 MG tablet Take 500 mg by mouth daily.    Multiple Vitamin (MULTIVITAMIN) LIQD Take 5 mLs by mouth daily.    pantoprazole (PROTONIX) 40 MG tablet Take 1 tablet (40 mg total) by mouth daily. Please call [phone] to schedule a diabetes follow up   rosuvastatin (CRESTOR) 20 MG tablet Take 1 tablet by mouth once daily   sertraline (ZOLOFT) 25 MG tablet Take 1 tablet (25 mg total) by mouth daily.   SUMAtriptan (IMITREX) 50 MG tablet Take 1 tablet (50 mg total) by mouth once for 1 dose. May repeat in 2 hours if headache persists or recurs.   SUMAtriptan (IMITREX) 50 MG tablet sumatriptan 50 mg tablet  TAKE 1 TABLT BY MOUTH ONCE FOR 1 DOSE MAY REPEAT IN 2 HOURS IF HEADACHE PERSISTS OR RECURS   tiZANidine (ZANAFLEX) 2 MG tablet Take 1 tablet (2 mg total) by mouth every 6 (six) hours as needed.   No facility-administered encounter medications on file as of 11/29/2021.   Have you had any problems recently with your health?   Have you had any problems with your pharmacy?   What issues or side effects are you having with  your medications?   What would you like me to pass along to [name], CPP for them to help you with?    What can we do to take care of you better?   Care Gaps   AWV: done 04/03/20  overdue 04/03/21 Colonoscopy: done 12/30/16 DM Eye Exam: overdue 02/13/21 DM Foot Exam: overdue 03/23/19 Microalbumin: over due 02/01/21 HbgAIC: over due 08/03/20 DEXA: unknown  Mammogram: due 02/05/22  Star Rating Drugs: rosuvastatin (CRESTOR) 20 MG tablet - last filled 07/13/21 90 days  metFORMIN (GLUCOPHAGE) 500 MG tablet - last filled 07/13/21 90 days  No future appointments.  Multiple attempts were made to contact patient. Attempts were unsuccessful. / ls,CMA   Jobe Gibbon, [place] Pharmacist Assistant  [phone]

## 2021-12-19 ENCOUNTER — Telehealth: Payer: Self-pay | Admitting: Pharmacist

## 2021-12-19 NOTE — Progress Notes (Signed)
? ? ?  Chronic Care Management ?Pharmacy Assistant  ? ?Name: Crystal Buchanan  MRN: [id number] DOB: [birth year] ? ? ?Reason for Encounter: Disease State - General Adherence Call ?  ? ?Recent office visits:  ?None noted ?  ?Recent consult visits:  ?None noted ?  ?Hospital visits:  ?None in previous 6 months ? ?Medications: ?Outpatient Encounter Medications as of 12/19/2021  ?Medication Sig  ? ALPRAZolam (XANAX) 0.5 MG tablet Take 1 tablet (0.5 mg total) by mouth 2 (two) times daily as needed for anxiety.  ? diclofenac sodium (VOLTAREN) 1 % GEL Apply 2 g topically 4 (four) times daily. (Patient taking differently: Apply 4 g topically 4 (four) times daily. )  ? HYDROcodone-acetaminophen (NORCO/VICODIN) 5-325 MG tablet Take 1 tablet by mouth every 6 (six) hours as needed for moderate pain.  ? metFORMIN (GLUCOPHAGE) 500 MG tablet TAKE 1 TABLET BY MOUTH TWICE DAILY WITH A MEAL  ? methocarbamol (ROBAXIN) 500 MG tablet Take 500 mg by mouth daily.   ? Multiple Vitamin (MULTIVITAMIN) LIQD Take 5 mLs by mouth daily.   ? pantoprazole (PROTONIX) 40 MG tablet Take 1 tablet (40 mg total) by mouth daily. Please call [phone] to schedule a diabetes follow up  ? rosuvastatin (CRESTOR) 20 MG tablet Take 1 tablet by mouth once daily  ? sertraline (ZOLOFT) 25 MG tablet Take 1 tablet (25 mg total) by mouth daily.  ? SUMAtriptan (IMITREX) 50 MG tablet Take 1 tablet (50 mg total) by mouth once for 1 dose. May repeat in 2 hours if headache persists or recurs.  ? SUMAtriptan (IMITREX) 50 MG tablet sumatriptan 50 mg tablet ? TAKE 1 TABLT BY MOUTH ONCE FOR 1 DOSE MAY REPEAT IN 2 HOURS IF HEADACHE PERSISTS OR RECURS  ? tiZANidine (ZANAFLEX) 2 MG tablet Take 1 tablet (2 mg total) by mouth every 6 (six) hours as needed.  ? ?No facility-administered encounter medications on file as of 12/19/2021.  ? ?Have you had any problems recently with your health? ?  ?  ?Have you had any problems with your pharmacy? ?  ?  ?What issues or side effects are you having  with your medications? ?  ?  ?What would you like me to pass along to [name], CPP for them to help you with?  ?  ?  ?What can we do to take care of you better? ?  ?  ?Care Gaps ?  ?AWV: done 04/03/20  overdue 04/03/21 ?Colonoscopy: done 12/30/16 ?DM Eye Exam: overdue 02/13/21 ?DM Foot Exam: overdue 03/23/19 ?Microalbumin: over due 02/01/21 ?HbgAIC: over due 08/03/20 ?DEXA: unknown  ?Mammogram: due 02/05/22 ? ?  ?Star Rating Drugs: ?rosuvastatin (CRESTOR) 20 MG tablet - last filled 07/13/21 90 days  ?metFORMIN (GLUCOPHAGE) 500 MG tablet - last filled 07/13/21 90 days  ? ? ?No future appointments. ? ?Multiple attempts were made to contact patient. Attempts were unsuccessful. / ls,CMA  ? ?Liza Showfety, CCMA ?Clinical Pharmacist Assistant  ?[phone] ? ?
